# Patient Record
Sex: Female | Born: 1985 | Race: Black or African American | Hispanic: No | Marital: Single | Smoking: Never smoker
Health system: Southern US, Community
[De-identification: ages and names within clinical notes are randomized; demographics above are authoritative.]

## PROBLEM LIST (undated history)

## (undated) DIAGNOSIS — A599 Trichomoniasis, unspecified: Secondary | ICD-10-CM

## (undated) DIAGNOSIS — Z789 Other specified health status: Secondary | ICD-10-CM

## (undated) DIAGNOSIS — Z8619 Personal history of other infectious and parasitic diseases: Secondary | ICD-10-CM

## (undated) HISTORY — DX: Personal history of other infectious and parasitic diseases: Z86.19

## (undated) HISTORY — PX: INDUCED ABORTION: SHX677

## (undated) HISTORY — PX: NO PAST SURGERIES: SHX2092

## (undated) HISTORY — DX: Trichomoniasis, unspecified: A59.9

---

## 2011-03-10 LAB — OB RESULTS CONSOLE HEPATITIS B SURFACE ANTIGEN: Hepatitis B Surface Ag: NEGATIVE

## 2011-03-10 LAB — OB RESULTS CONSOLE RPR: RPR: NONREACTIVE

## 2011-03-10 LAB — OB RESULTS CONSOLE ANTIBODY SCREEN: Antibody Screen: NEGATIVE

## 2011-03-10 LAB — OB RESULTS CONSOLE ABO/RH: RH Type: POSITIVE

## 2011-03-10 LAB — OB RESULTS CONSOLE GC/CHLAMYDIA: Chlamydia: NEGATIVE

## 2011-04-25 NOTE — L&D Delivery Note (Addendum)
Delivery Note At 3:45 AM a viable female, "Cassie Lawrence"  was delivered via Vaginal, Spontaneous Delivery (Presentation: Right Occiput Anterior).  APGAR: 8, 9; weight 6 lb 14 oz (3118 g).   Placenta status: Intact, Spontaneous.  Cord: 3 vessels with the following complications: Cord loosely around neck, delivered through loop, then noted around body and leg..  Cord pH: NA Patient progressed rapidly to delivery after reaching complete dilation.  Anesthesia: Local  Episiotomy: None Lacerations: 2nd degree Suture Repair: 3.0 and 4.0 monocryl Est. Blood Loss (mL):   Mom to postpartum.  Baby to skin to skin with mom, then to Dad's arms due to mother's sedation from recent IV pain medication.. Inpatient circumcision planned.  Nigel Bridgeman 10/04/2011, 4:28 AM

## 2011-07-11 ENCOUNTER — Encounter (INDEPENDENT_AMBULATORY_CARE_PROVIDER_SITE_OTHER): Payer: Commercial Managed Care - PPO | Admitting: Obstetrics and Gynecology

## 2011-07-11 ENCOUNTER — Other Ambulatory Visit (INDEPENDENT_AMBULATORY_CARE_PROVIDER_SITE_OTHER): Payer: Commercial Managed Care - PPO

## 2011-07-11 DIAGNOSIS — Z331 Pregnant state, incidental: Secondary | ICD-10-CM

## 2011-07-25 ENCOUNTER — Encounter (INDEPENDENT_AMBULATORY_CARE_PROVIDER_SITE_OTHER): Payer: Commercial Managed Care - PPO | Admitting: Registered Nurse

## 2011-07-25 DIAGNOSIS — Z3480 Encounter for supervision of other normal pregnancy, unspecified trimester: Secondary | ICD-10-CM

## 2011-08-08 DIAGNOSIS — Z880 Allergy status to penicillin: Secondary | ICD-10-CM | POA: Insufficient documentation

## 2011-08-09 ENCOUNTER — Ambulatory Visit (INDEPENDENT_AMBULATORY_CARE_PROVIDER_SITE_OTHER): Payer: Commercial Managed Care - PPO | Admitting: Obstetrics and Gynecology

## 2011-08-09 ENCOUNTER — Encounter: Payer: Self-pay | Admitting: Obstetrics and Gynecology

## 2011-08-09 VITALS — BP 102/72 | Ht 65.0 in | Wt 188.0 lb

## 2011-08-09 DIAGNOSIS — Z880 Allergy status to penicillin: Secondary | ICD-10-CM

## 2011-08-09 DIAGNOSIS — T360X5A Adverse effect of penicillins, initial encounter: Secondary | ICD-10-CM

## 2011-08-09 DIAGNOSIS — Z888 Allergy status to other drugs, medicaments and biological substances status: Secondary | ICD-10-CM

## 2011-08-09 DIAGNOSIS — Z331 Pregnant state, incidental: Secondary | ICD-10-CM

## 2011-08-09 NOTE — Progress Notes (Signed)
A/P Fetal kick counts reviewed All patients  questions answered

## 2011-08-09 NOTE — Assessment & Plan Note (Signed)
Do GBS with sensitivities

## 2011-08-09 NOTE — Progress Notes (Deleted)
Pt without complaints REPEAT PAP VISIT  Subjective:    Cassie Lawrence is a 26 y.o. female who presents for a  repeat pap   History of previous cervical treatment:  {Therapies; recommendations colposcopy:727}  Last pap showed:  {Findings; lab pap smear results:16707::"no abnormalities"}  Date ***/***/2013  High Risk HPV present: {yes no:314532}  Colposcopy results:   {Exam; colposcopy summary:733::"in ***"}   Date:   ***/***/2013  Gardasil received: {yes no:314532}  Offered:  {Yes/No-Ex:120004} Tobacco use:  {EXAM; YES/NO:19492::"No"}  Objective:   Pelvic:   External genitalia: normal Perianal skin: no external genital warts noted Vagina: normal without discharge Cervix: normal in appearance  Assessment and Plan:   Abnormal pap or biopsy demonstrating ***  Pap # ***/3 performed Return to the office in 4 months for repeat Pap until 3 consecutive normal Pap Smear Continue Folic Acid 1 mg daily  Cassie Lawrence AMD 4/17/20135:17 PM

## 2011-08-09 NOTE — Progress Notes (Signed)
Pt states she has no concerns today.  

## 2011-08-09 NOTE — Patient Instructions (Signed)
Fetal Movement Counts Patient Name: __________________________________________________ Patient Due Date: ____________________ Kick counts is highly recommended in high risk pregnancies, but it is a good idea for every pregnant woman to do. Start counting fetal movements at 28 weeks of the pregnancy. Fetal movements increase after eating a full meal or eating or drinking something sweet (the blood sugar is higher). It is also important to drink plenty of fluids (well hydrated) before doing the count. Lie on your left side because it helps with the circulation or you can sit in a comfortable chair with your arms over your belly (abdomen) with no distractions around you. DOING THE COUNT  Try to do the count the same time of day each time you do it.   Mark the day and time, then see how long it takes for you to feel 10 movements (kicks, flutters, swishes, rolls). You should have at least 10 movements within 2 hours. You will most likely feel 10 movements in much less than 2 hours. If you do not, wait an hour and count again. After a couple of days you will see a pattern.   What you are looking for is a change in the pattern or not enough counts in 2 hours. Is it taking longer in time to reach 10 movements?  SEEK MEDICAL CARE IF:  You feel less than 10 counts in 2 hours. Tried twice.   No movement in one hour.   The pattern is changing or taking longer each day to reach 10 counts in 2 hours.   You feel the baby is not moving as it usually does.  Date: ____________ Movements: ____________ Start time: ____________ Finish time: ____________  Date: ____________ Movements: ____________ Start time: ____________ Finish time: ____________ Date: ____________ Movements: ____________ Start time: ____________ Finish time: ____________ Date: ____________ Movements: ____________ Start time: ____________ Finish time: ____________ Date: ____________ Movements: ____________ Start time: ____________ Finish time:  ____________ Date: ____________ Movements: ____________ Start time: ____________ Finish time: ____________ Date: ____________ Movements: ____________ Start time: ____________ Finish time: ____________ Date: ____________ Movements: ____________ Start time: ____________ Finish time: ____________  Date: ____________ Movements: ____________ Start time: ____________ Finish time: ____________ Date: ____________ Movements: ____________ Start time: ____________ Finish time: ____________ Date: ____________ Movements: ____________ Start time: ____________ Finish time: ____________ Date: ____________ Movements: ____________ Start time: ____________ Finish time: ____________ Date: ____________ Movements: ____________ Start time: ____________ Finish time: ____________ Date: ____________ Movements: ____________ Start time: ____________ Finish time: ____________ Date: ____________ Movements: ____________ Start time: ____________ Finish time: ____________  Date: ____________ Movements: ____________ Start time: ____________ Finish time: ____________ Date: ____________ Movements: ____________ Start time: ____________ Finish time: ____________ Date: ____________ Movements: ____________ Start time: ____________ Finish time: ____________ Date: ____________ Movements: ____________ Start time: ____________ Finish time: ____________ Date: ____________ Movements: ____________ Start time: ____________ Finish time: ____________ Date: ____________ Movements: ____________ Start time: ____________ Finish time: ____________ Date: ____________ Movements: ____________ Start time: ____________ Finish time: ____________  Date: ____________ Movements: ____________ Start time: ____________ Finish time: ____________ Date: ____________ Movements: ____________ Start time: ____________ Finish time: ____________ Date: ____________ Movements: ____________ Start time: ____________ Finish time: ____________ Date: ____________ Movements:  ____________ Start time: ____________ Finish time: ____________ Date: ____________ Movements: ____________ Start time: ____________ Finish time: ____________ Date: ____________ Movements: ____________ Start time: ____________ Finish time: ____________ Date: ____________ Movements: ____________ Start time: ____________ Finish time: ____________  Date: ____________ Movements: ____________ Start time: ____________ Finish time: ____________ Date: ____________ Movements: ____________ Start time: ____________ Finish time: ____________ Date: ____________ Movements: ____________ Start time:   ____________ Finish time: ____________ Date: ____________ Movements: ____________ Start time: ____________ Finish time: ____________ Date: ____________ Movements: ____________ Start time: ____________ Finish time: ____________ Date: ____________ Movements: ____________ Start time: ____________ Finish time: ____________ Date: ____________ Movements: ____________ Start time: ____________ Finish time: ____________  Date: ____________ Movements: ____________ Start time: ____________ Finish time: ____________ Date: ____________ Movements: ____________ Start time: ____________ Finish time: ____________ Date: ____________ Movements: ____________ Start time: ____________ Finish time: ____________ Date: ____________ Movements: ____________ Start time: ____________ Finish time: ____________ Date: ____________ Movements: ____________ Start time: ____________ Finish time: ____________ Date: ____________ Movements: ____________ Start time: ____________ Finish time: ____________ Date: ____________ Movements: ____________ Start time: ____________ Finish time: ____________  Date: ____________ Movements: ____________ Start time: ____________ Finish time: ____________ Date: ____________ Movements: ____________ Start time: ____________ Finish time: ____________ Date: ____________ Movements: ____________ Start time: ____________ Finish  time: ____________ Date: ____________ Movements: ____________ Start time: ____________ Finish time: ____________ Date: ____________ Movements: ____________ Start time: ____________ Finish time: ____________ Date: ____________ Movements: ____________ Start time: ____________ Finish time: ____________ Date: ____________ Movements: ____________ Start time: ____________ Finish time: ____________  Date: ____________ Movements: ____________ Start time: ____________ Finish time: ____________ Date: ____________ Movements: ____________ Start time: ____________ Finish time: ____________ Date: ____________ Movements: ____________ Start time: ____________ Finish time: ____________ Date: ____________ Movements: ____________ Start time: ____________ Finish time: ____________ Date: ____________ Movements: ____________ Start time: ____________ Finish time: ____________ Date: ____________ Movements: ____________ Start time: ____________ Finish time: ____________ Document Released: 05/10/2006 Document Revised: 03/30/2011 Document Reviewed: 11/10/2008 Exit[employer] Patient Information 2012 [employer]. 

## 2011-08-23 ENCOUNTER — Ambulatory Visit (INDEPENDENT_AMBULATORY_CARE_PROVIDER_SITE_OTHER): Payer: 59 | Admitting: Obstetrics and Gynecology

## 2011-08-23 ENCOUNTER — Encounter: Payer: Commercial Managed Care - PPO | Admitting: Obstetrics and Gynecology

## 2011-08-23 VITALS — BP 110/72 | Wt 194.0 lb

## 2011-08-23 DIAGNOSIS — Z331 Pregnant state, incidental: Secondary | ICD-10-CM

## 2011-08-23 NOTE — Progress Notes (Signed)
Pt c/o: off and on pressure. [facility] CMA

## 2011-08-23 NOTE — Progress Notes (Signed)
Doing well. Return to office in 2 weeks. Dr. Asal Teas 

## 2011-08-30 ENCOUNTER — Encounter: Payer: Self-pay | Admitting: Obstetrics and Gynecology

## 2011-08-30 ENCOUNTER — Telehealth: Payer: Self-pay | Admitting: Obstetrics and Gynecology

## 2011-08-30 ENCOUNTER — Ambulatory Visit (HOSPITAL_COMMUNITY)
Admission: RE | Admit: 2011-08-30 | Discharge: 2011-08-30 | Disposition: A | Discharge status: Home / Self Care | Payer: 59 | Source: Ambulatory Visit | Attending: Obstetrics and Gynecology | Admitting: Obstetrics and Gynecology

## 2011-08-30 ENCOUNTER — Ambulatory Visit (INDEPENDENT_AMBULATORY_CARE_PROVIDER_SITE_OTHER): Payer: 59 | Admitting: Obstetrics and Gynecology

## 2011-08-30 ENCOUNTER — Other Ambulatory Visit: Payer: Self-pay | Admitting: Obstetrics and Gynecology

## 2011-08-30 VITALS — BP 108/68 | Wt 190.0 lb

## 2011-08-30 DIAGNOSIS — Z3689 Encounter for other specified antenatal screening: Secondary | ICD-10-CM | POA: Insufficient documentation

## 2011-08-30 DIAGNOSIS — O4693 Antepartum hemorrhage, unspecified, third trimester: Secondary | ICD-10-CM

## 2011-08-30 DIAGNOSIS — O26859 Spotting complicating pregnancy, unspecified trimester: Secondary | ICD-10-CM

## 2011-08-30 DIAGNOSIS — O4690 Antepartum hemorrhage, unspecified, unspecified trimester: Secondary | ICD-10-CM | POA: Insufficient documentation

## 2011-08-30 LAB — POCT URINALYSIS DIPSTICK
Bilirubin, UA: NEGATIVE
Ketones, UA: NEGATIVE
Leukocytes, UA: NEGATIVE

## 2011-08-30 LAB — POCT WET PREP (WET MOUNT)
Bacteria Wet Prep HPF POC: NEGATIVE
KOH Wet Prep POC: NEGATIVE

## 2011-08-30 NOTE — Telephone Encounter (Signed)
Cassie Lawrence received 

## 2011-08-30 NOTE — Progress Notes (Signed)
Patient seen at office this am by Dr. Normand Sloop for spotting, with normal exam. Sent to [facility] for limited US for fluid, placental evaluation, and BPP. All findings WNL, with BPP 8/8. (See report). Patient has had no further bleeding, and no pain.  Reviewed findings with patient. Recommended increased rest next 24 hours and pelvic rest at present. To call for any increase in bleeding, pain, any decrease in FM, etc. To keep ROB appointment as scheduled next week or call prn.  Note to be OOW tonight.  Cassie Lawrence, CNM, [place] 08/30/11 1:50pm

## 2011-08-30 NOTE — Telephone Encounter (Signed)
TC from pt.  States had red vaginal spotting this AM with wiping. No recent IC or exam. +FM No cont.  Sched for eval with Dr ND today.

## 2011-08-30 NOTE — Progress Notes (Signed)
Pt with vaginal spotting this morning.  She worked all night.  No ctxs or LOF.  Positive FM Speculum exam: no blood in the vault.  cx closed /50%/high Results for orders placed in visit on 08/30/11  POCT URINALYSIS DIPSTICK      Component Value Range   Color, UA       Clarity, UA       Glucose, UA negative     Bilirubin, UA negative     Ketones, UA negative     Spec Grav, UA 1.010     Blood, UA 250     pH, UA       Protein, UA negative     Urobilinogen, UA negative     Nitrite, UA negative     Leukocytes, UA Negative     third trimester bleeding No evidence of bleeding on exam today Check urine culture [place] with BPP tomorrow.    We have no apointments today.  NST today [facility] reviewed

## 2011-08-30 NOTE — Progress Notes (Signed)
Pt c/o spotting starting this morning.

## 2011-09-05 ENCOUNTER — Ambulatory Visit (INDEPENDENT_AMBULATORY_CARE_PROVIDER_SITE_OTHER): Payer: 59 | Admitting: Obstetrics and Gynecology

## 2011-09-05 ENCOUNTER — Encounter: Payer: Self-pay | Admitting: Obstetrics and Gynecology

## 2011-09-05 VITALS — BP 100/60 | Wt 192.0 lb

## 2011-09-05 DIAGNOSIS — Z331 Pregnant state, incidental: Secondary | ICD-10-CM

## 2011-09-05 LAB — OB RESULTS CONSOLE GBS: GBS: NEGATIVE

## 2011-09-05 NOTE — Progress Notes (Signed)
Pt no longer with vb A/P GBS done today Fetal kick counts reviewed Labor reviewed with pt All patients  questions answered

## 2011-09-06 LAB — CULTURE, OB URINE: Colony Count: NO GROWTH

## 2011-09-08 LAB — CULTURE, BETA STREP (GROUP B ONLY)

## 2011-09-12 ENCOUNTER — Encounter: Payer: Self-pay | Admitting: Obstetrics and Gynecology

## 2011-09-12 ENCOUNTER — Ambulatory Visit (INDEPENDENT_AMBULATORY_CARE_PROVIDER_SITE_OTHER): Payer: 59 | Admitting: Obstetrics and Gynecology

## 2011-09-12 VITALS — BP 110/60 | Wt 193.0 lb

## 2011-09-12 DIAGNOSIS — Z331 Pregnant state, incidental: Secondary | ICD-10-CM

## 2011-09-12 NOTE — Progress Notes (Signed)
A/P GBS negative Fetal kick counts reviewed Labor reviewed with pt All patients  questions answered 

## 2011-09-20 ENCOUNTER — Ambulatory Visit (INDEPENDENT_AMBULATORY_CARE_PROVIDER_SITE_OTHER): Payer: 59 | Admitting: Obstetrics and Gynecology

## 2011-09-20 ENCOUNTER — Encounter: Payer: Self-pay | Admitting: Obstetrics and Gynecology

## 2011-09-20 VITALS — BP 104/70 | Wt 195.0 lb

## 2011-09-20 DIAGNOSIS — Z331 Pregnant state, incidental: Secondary | ICD-10-CM

## 2011-09-20 NOTE — Progress Notes (Signed)
Doing well.  Return in one week.

## 2011-09-27 ENCOUNTER — Ambulatory Visit (INDEPENDENT_AMBULATORY_CARE_PROVIDER_SITE_OTHER): Payer: 59 | Admitting: Obstetrics and Gynecology

## 2011-09-27 ENCOUNTER — Encounter: Payer: Self-pay | Admitting: Obstetrics and Gynecology

## 2011-09-27 VITALS — BP 104/74 | Wt 193.0 lb

## 2011-09-27 DIAGNOSIS — Z3490 Encounter for supervision of normal pregnancy, unspecified, unspecified trimester: Secondary | ICD-10-CM

## 2011-09-27 DIAGNOSIS — Z331 Pregnant state, incidental: Secondary | ICD-10-CM

## 2011-09-27 NOTE — Progress Notes (Signed)
Pt states she has no concerns. Pt desires cervix check.

## 2011-10-03 ENCOUNTER — Inpatient Hospital Stay (HOSPITAL_COMMUNITY)
Admission: AD | Admit: 2011-10-03 | Discharge: 2011-10-06 | DRG: 775 | Disposition: A | Discharge status: Home / Self Care | Payer: 59 | Source: Ambulatory Visit | Attending: Obstetrics and Gynecology | Admitting: Obstetrics and Gynecology

## 2011-10-03 ENCOUNTER — Encounter: Payer: Self-pay | Admitting: Obstetrics and Gynecology

## 2011-10-03 ENCOUNTER — Encounter (HOSPITAL_COMMUNITY): Payer: Self-pay | Admitting: *Deleted

## 2011-10-03 ENCOUNTER — Ambulatory Visit (INDEPENDENT_AMBULATORY_CARE_PROVIDER_SITE_OTHER): Payer: 59 | Admitting: Obstetrics and Gynecology

## 2011-10-03 VITALS — BP 98/66 | Wt 196.0 lb

## 2011-10-03 DIAGNOSIS — B373 Candidiasis of vulva and vagina: Secondary | ICD-10-CM

## 2011-10-03 DIAGNOSIS — IMO0001 Reserved for inherently not codable concepts without codable children: Secondary | ICD-10-CM

## 2011-10-03 DIAGNOSIS — Z331 Pregnant state, incidental: Secondary | ICD-10-CM

## 2011-10-03 HISTORY — DX: Other specified health status: Z78.9

## 2011-10-03 MED ORDER — FLUCONAZOLE 200 MG PO TABS: 200.0000 mg | ORAL_TABLET | Freq: Once | ORAL | Status: DC

## 2011-10-03 NOTE — MAU Note (Signed)
Pt presents with contractions, denies bleeding or ROM 

## 2011-10-03 NOTE — MAU Note (Signed)
Pt states she started feeling contractions this morning about 11am . Pt states contractions have increased in intensity

## 2011-10-03 NOTE — Progress Notes (Addendum)
Patient ID: Cassie Lawrence, female   DOB: Mar 02, 1986, 26 y.o.   MRN: [id number] Presents with c/o of uc about 6 per hour since this am denies srom, vag bleeding, N,V,D, or constipation, with  +fetal movement Mod amount yeast discharge, discussed diflucan rx. Plan report if sx change worsen, reviewed s/s uc more intense and frequent, srom, vag bleeding, daily fetal kick counts to report, Encouragged 8 water daily and frequent voids.Keep f/o office appt. Lavera Guise, CNM

## 2011-10-03 NOTE — Progress Notes (Signed)
C/O severe sharp cramping in lower abd radiating to lower back Pt desires cx check & to have membranes sweeped

## 2011-10-04 ENCOUNTER — Encounter (HOSPITAL_COMMUNITY): Payer: Self-pay | Admitting: *Deleted

## 2011-10-04 ENCOUNTER — Telehealth: Payer: Self-pay | Admitting: Obstetrics and Gynecology

## 2011-10-04 ENCOUNTER — Ambulatory Visit (INDEPENDENT_AMBULATORY_CARE_PROVIDER_SITE_OTHER): Payer: 59 | Admitting: Pediatrics

## 2011-10-04 DIAGNOSIS — IMO0001 Reserved for inherently not codable concepts without codable children: Secondary | ICD-10-CM

## 2011-10-04 DIAGNOSIS — Z7681 Expectant parent(s) prebirth pediatrician visit: Secondary | ICD-10-CM

## 2011-10-04 LAB — CBC
Hemoglobin: 13.6 g/dL (ref 12.0–15.0)
MCH: 30.8 pg (ref 26.0–34.0)
Platelets: 178 10*3/uL (ref 150–400)
RBC: 4.42 MIL/uL (ref 3.87–5.11)
WBC: 14.4 10*3/uL — ABNORMAL HIGH (ref 4.0–10.5)

## 2011-10-04 LAB — RPR: RPR Ser Ql: NONREACTIVE

## 2011-10-04 MED ORDER — BUTORPHANOL TARTRATE 2 MG/ML IJ SOLN
1.0000 mg | INTRAMUSCULAR | Status: DC | PRN
  Administered 2011-10-04: 1 mg via INTRAVENOUS
  Filled 2011-10-04: qty 1

## 2011-10-04 MED ORDER — LACTATED RINGERS IV SOLN: INTRAVENOUS | Status: DC

## 2011-10-04 MED ORDER — OXYCODONE-ACETAMINOPHEN 5-325 MG PO TABS: 1.0000 | ORAL_TABLET | ORAL | Status: DC | PRN

## 2011-10-04 MED ORDER — OXYTOCIN 20 UNITS IN LACTATED RINGERS INFUSION - SIMPLE: 125.0000 mL/h | Freq: Once | INTRAVENOUS | Status: DC

## 2011-10-04 MED ORDER — PHENYLEPHRINE 40 MCG/ML (10ML) SYRINGE FOR IV PUSH (FOR BLOOD PRESSURE SUPPORT)
80.0000 ug | PREFILLED_SYRINGE | INTRAVENOUS | Status: DC | PRN
  Filled 2011-10-04: qty 2

## 2011-10-04 MED ORDER — IBUPROFEN 600 MG PO TABS
600.0000 mg | ORAL_TABLET | Freq: Four times a day (QID) | ORAL | Status: DC
  Administered 2011-10-04 – 2011-10-06 (×9): 600 mg via ORAL
  Filled 2011-10-04 (×9): qty 1

## 2011-10-04 MED ORDER — LACTATED RINGERS IV SOLN
500.0000 mL | Freq: Once | INTRAVENOUS | Status: AC
  Administered 2011-10-04: 500 mL via INTRAVENOUS

## 2011-10-04 MED ORDER — FLEET ENEMA 7-19 GM/118ML RE ENEM: 1.0000 | ENEMA | RECTAL | Status: DC | PRN

## 2011-10-04 MED ORDER — ONDANSETRON HCL 4 MG/2ML IJ SOLN: 4.0000 mg | INTRAMUSCULAR | Status: DC | PRN

## 2011-10-04 MED ORDER — PRENATAL MULTIVITAMIN CH
1.0000 | ORAL_TABLET | Freq: Every day | ORAL | Status: DC
  Administered 2011-10-04 – 2011-10-06 (×3): 1 via ORAL
  Filled 2011-10-04 (×3): qty 1

## 2011-10-04 MED ORDER — IBUPROFEN 600 MG PO TABS
600.0000 mg | ORAL_TABLET | Freq: Four times a day (QID) | ORAL | Status: DC | PRN
  Administered 2011-10-04: 600 mg via ORAL
  Filled 2011-10-04: qty 1

## 2011-10-04 MED ORDER — TETANUS-DIPHTH-ACELL PERTUSSIS 5-2.5-18.5 LF-MCG/0.5 IM SUSP: 0.5000 mL | Freq: Once | INTRAMUSCULAR | Status: DC

## 2011-10-04 MED ORDER — FENTANYL 2.5 MCG/ML BUPIVACAINE 1/10 % EPIDURAL INFUSION (WH - ANES): 14.0000 mL/h | INTRAMUSCULAR | Status: DC

## 2011-10-04 MED ORDER — LIDOCAINE HCL (PF) 1 % IJ SOLN
30.0000 mL | INTRAMUSCULAR | Status: DC | PRN
  Administered 2011-10-04: 30 mL via SUBCUTANEOUS
  Filled 2011-10-04: qty 30

## 2011-10-04 MED ORDER — SIMETHICONE 80 MG PO CHEW: 80.0000 mg | CHEWABLE_TABLET | ORAL | Status: DC | PRN

## 2011-10-04 MED ORDER — SENNOSIDES-DOCUSATE SODIUM 8.6-50 MG PO TABS
2.0000 | ORAL_TABLET | Freq: Every day | ORAL | Status: DC
  Administered 2011-10-04 – 2011-10-05 (×2): 2 via ORAL

## 2011-10-04 MED ORDER — ACETAMINOPHEN 325 MG PO TABS: 650.0000 mg | ORAL_TABLET | ORAL | Status: DC | PRN

## 2011-10-04 MED ORDER — ZOLPIDEM TARTRATE 5 MG PO TABS: 5.0000 mg | ORAL_TABLET | Freq: Every evening | ORAL | Status: DC | PRN

## 2011-10-04 MED ORDER — PROMETHAZINE HCL 25 MG/ML IJ SOLN
12.5000 mg | INTRAMUSCULAR | Status: DC | PRN
  Administered 2011-10-04: 12.5 mg via INTRAVENOUS
  Filled 2011-10-04: qty 1

## 2011-10-04 MED ORDER — WITCH HAZEL-GLYCERIN EX PADS: 1.0000 "application " | MEDICATED_PAD | CUTANEOUS | Status: DC | PRN

## 2011-10-04 MED ORDER — ONDANSETRON HCL 4 MG/2ML IJ SOLN: 4.0000 mg | Freq: Four times a day (QID) | INTRAMUSCULAR | Status: DC | PRN

## 2011-10-04 MED ORDER — FENTANYL CITRATE 0.05 MG/ML IJ SOLN
100.0000 ug | INTRAMUSCULAR | Status: DC | PRN
  Administered 2011-10-04: 100 ug via INTRAVENOUS

## 2011-10-04 MED ORDER — EPHEDRINE 5 MG/ML INJ
10.0000 mg | INTRAVENOUS | Status: DC | PRN
  Filled 2011-10-04: qty 2

## 2011-10-04 MED ORDER — CITRIC ACID-SODIUM CITRATE 334-500 MG/5ML PO SOLN: 30.0000 mL | ORAL | Status: DC | PRN

## 2011-10-04 MED ORDER — LANOLIN HYDROUS EX OINT: TOPICAL_OINTMENT | CUTANEOUS | Status: DC | PRN

## 2011-10-04 MED ORDER — OXYTOCIN BOLUS FROM INFUSION
500.0000 mL | Freq: Once | INTRAVENOUS | Status: DC
  Filled 2011-10-04: qty 1000
  Filled 2011-10-04: qty 500

## 2011-10-04 MED ORDER — FENTANYL CITRATE 0.05 MG/ML IJ SOLN
INTRAMUSCULAR | Status: AC
Start: 2011-10-04 — End: 2011-10-04
  Filled 2011-10-04: qty 2

## 2011-10-04 MED ORDER — DIPHENHYDRAMINE HCL 50 MG/ML IJ SOLN: 12.5000 mg | INTRAMUSCULAR | Status: DC | PRN

## 2011-10-04 MED ORDER — LACTATED RINGERS IV SOLN: 500.0000 mL | INTRAVENOUS | Status: DC | PRN

## 2011-10-04 MED ORDER — BENZOCAINE-MENTHOL 20-0.5 % EX AERO
1.0000 "application " | INHALATION_SPRAY | CUTANEOUS | Status: DC | PRN
  Administered 2011-10-04: 1 via TOPICAL
  Filled 2011-10-04: qty 56

## 2011-10-04 MED ORDER — ONDANSETRON HCL 4 MG PO TABS: 4.0000 mg | ORAL_TABLET | ORAL | Status: DC | PRN

## 2011-10-04 MED ORDER — DIBUCAINE 1 % RE OINT: 1.0000 "application " | TOPICAL_OINTMENT | RECTAL | Status: DC | PRN

## 2011-10-04 MED ORDER — DIPHENHYDRAMINE HCL 25 MG PO CAPS: 25.0000 mg | ORAL_CAPSULE | Freq: Four times a day (QID) | ORAL | Status: DC | PRN

## 2011-10-04 NOTE — H&P (Signed)
Cassie Lawrence is a 26 y.o. female, G2P0010 at 45 3/7 weeks, presenting c/o persistent UCs x several hours, with small amount bloody mucus noted.  Reports +FM, denies leaking.  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  History of present pregnancy: Patient entered care at 10 weeks.  EDC of 10/07/11 was established by LMP and 1st trimester [place].  Anatomy scan was done at 19 weeks, with normal findings and an posterior placenta.  Her prenatal course was essentially uncomplicated.  Genetic screenings were WNL. At her last evalution, she was 3 cm..  History OB History    Grav Para Term Preterm Abortions TAB SAB Ect Mult Living   2    1         #1--2006 SAB #2--Current  Past Medical History  Diagnosis Date  . H/O chlamydia infection   . History of gonorrhea   . Trichomonas infection   . H/O candidiasis   . H/O varicella   . No pertinent past medical history    Past Surgical History  Procedure Date  . Induced abortion   . No past surgeries    Family History: family history includes Asthma in her maternal grandmother; COPD in her maternal grandmother; Diabetes in her maternal grandfather and maternal grandmother; Heart disease in her maternal grandmother; and Hypertension in her mother.  Social History:  reports that she has never smoked. She has never used smokeless tobacco. She reports that she does not drink alcohol or use illicit drugs.  FOB is involved and supportive.  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  ROS:  Contractions every 5 minutes, small amount bloody mucus, positive FM.  Dilation: 3.5 Effacement (%): 80 Station: -1 Blood pressure 124/82, pulse 76, temperature 98.6 F (37 C), temperature source Oral, resp. rate 18, height 5' 6.5" (1.689 m), weight 197 lb (89.359 kg), last menstrual period 12/25/2010, SpO2 100.00%. Exam Physical Exam   Chest clear Heart RRR without murmur Abd gravid,  NT Pelvic as above, posterior, IBOW  FHR reactive in cycles.  When in sleep cycle, 2 variable decels noted. UCs q 3-5, mild, moderate.  Prenatal labs: ABO, Rh:  O+ Antibody:  Neg Rubella:  Immune RPR:   NR HBsAg:  Neg  HIV:   NR GBS:   Negative 1st trimester screen and AFP WNL Glucola WNL Hgb WNL at NOB and at 28 weeks Sickle cell negative.  Assessment/Plan: IUP at 39 3/7 weeks Early labor Negative GBS Undecided regarding pain medication plan  Plan: Admit to Birthing Suite per consult with Dr. Normand Sloop Routine [facility] orders Plan IV hydration due to sporadic variable decels. Pain med, labor support prn.  Shonna Deiter 10/04/2011, 12:09 AM

## 2011-10-04 NOTE — Telephone Encounter (Signed)
TC from patient in f/u to previous call. UCs still q 5 min, slightly stronger.  Come to MAU.

## 2011-10-04 NOTE — Telephone Encounter (Signed)
TC from patient--G2P0 at 39 weeks, 3 cm in office earlier today. Now with contractions every 5 minutes x 2 hours, mild/moderate, bu short. Positive FM, no leaking or bleeding.  GBS negative.  Discussed status with patient, with options for observation or coming to MAU for evaluation. Decides to wait a little longer before coming to hospital. S/S of advancing labor reviewed.

## 2011-10-05 LAB — CBC
MCH: 30.6 pg (ref 26.0–34.0)
MCV: 91.9 fL (ref 78.0–100.0)
Platelets: 154 10*3/uL (ref 150–400)
RDW: 13.1 % (ref 11.5–15.5)

## 2011-10-05 NOTE — Progress Notes (Signed)
S: comfortable, little bleeding, slept     Breastfeeding with some assistance O VSS     abd soft, nt, ff      sm  flowperineum clean intact     -Homans sign bilaterally,       No edema      2 degree perineal lac well approximated no redness, edema, or drainage Hemoglobin & Hematocrit     Component Value Date/Time   HGB 11.7* 10/05/2011 0515   HCT 35.1* 10/05/2011 0515    A normal involution     Lactating     PP day 1 P considering micronor, continue care Lavera Guise, CNM

## 2011-10-05 NOTE — Progress Notes (Signed)
S: comfortable, little bleeding, slept little     Breast feeding O VSS     abd soft, nt, ff      sm  flowperineum clean intact     -Homans sign bilaterally,       No edema A normal involution     Lactating     PP day 1 P undecided regarding birth control, continue care Lavera Guise, CNM

## 2011-10-06 ENCOUNTER — Ambulatory Visit (HOSPITAL_COMMUNITY): Payer: 59

## 2011-10-06 ENCOUNTER — Institutional Professional Consult (permissible substitution): Payer: 59 | Admitting: Pediatrics

## 2011-10-06 MED ORDER — NORETHINDRONE 0.35 MG PO TABS: 1.0000 | ORAL_TABLET | Freq: Every day | ORAL | Status: DC

## 2011-10-06 MED ORDER — IBUPROFEN 600 MG PO TABS: 600.0000 mg | ORAL_TABLET | Freq: Four times a day (QID) | ORAL | Status: AC | PRN

## 2011-10-06 NOTE — Discharge Summary (Signed)
Obstetric Discharge Summary Reason for Admission: onset of labor Prenatal Procedures: NST and ultrasound Intrapartum Procedures: spontaneous vaginal delivery Postpartum Procedures: none Complications-Operative and Postpartum: 2nd degree perineal laceration Hemoglobin  Date Value Range Status  10/05/2011 11.7* 12.0 - 15.0 g/dL Final     HCT  Date Value Range Status  10/05/2011 35.1* 36.0 - 46.0 % Final   Hospital Course: Admitted 10/03/11 in early labor.  Negative GBS. Progressed well to delivery, using IV pain medication. Delivery was performed by Nigel Bridgeman, CNM, without complication. Patient and baby tolerated the procedure without difficulty, with  2nd laceration noted. Infant to FTN. Mother and infant then had an uncomplicated postpartum course, with breast feeding going well. Mom's physical exam was WNL, and she was discharged home in stable condition. Contraception plan was Micronor.  She received adequate benefit from po pain medications, using Motrin with benefit.  Plans Micronor for contraception.      Physical Exam:  General: alert Lochia: appropriate Uterine Fundus: firm Incision: healing well DVT Evaluation: No evidence of DVT seen on physical exam. Negative Homan's sign.  Discharge Diagnoses: Term Pregnancy-delivered  Discharge Information: Date: 10/06/2011 Activity: per [facility] handout Diet: routine Medications: Ibuprofen and Micronor Condition: stable Instructions: refer to practice specific booklet Discharge to: home Follow-up Information    Follow up with [facility] in 6 weeks. (Call to schedule 6 week appointment)          Newborn Data: Live born female  Birth Weight: 6 lb 14 oz (3118 g) APGAR: 8, 9  Home with mother.  Nigel Bridgeman 10/06/2011, 8:18 AM

## 2011-10-06 NOTE — Discharge Instructions (Signed)

## 2011-10-09 ENCOUNTER — Ambulatory Visit (HOSPITAL_COMMUNITY)
Admit: 2011-10-09 | Discharge: 2011-10-09 | Disposition: A | Discharge status: Home / Self Care | Payer: 59 | Attending: Pediatrics | Admitting: Pediatrics

## 2011-10-09 NOTE — Progress Notes (Signed)
Adult Lactation Consultation Outpatient Visit Note  Patient Name: Cassie Lawrence Date of Birth: 1985/09/14 Gestational Age at Delivery: Unknown Type of Delivery: vag  Breastfeeding History: Frequency of Breastfeeding: q1-2 Length of Feeding: 45 minutes- 1 hour Voids:5 last 24 hours  Stools: 6 last 24 hours  Supplementing / Method: Pumping:  Type of Pump:has own Medela PIS   Frequency:  Volume:    Comments:    Consultation Evaluation:  Initial Feeding Assessment: Pre-feed Weight:6- 5.8oz  2886g Post-feed Weight: 6- 6.7oz  2912g Amount Transferred: 26 cc's Comments:Mom has been using NS on both nipples at every feeding. Attempted without NS and baby latched and nursed for 20 minutes with lots of swallows noted. Mom pleased that baby latched without NS. Reports no pain, only tugging felt with latch. Breast did soften after nursing.   Additional Feeding Assessment: Pre-feed Weight:6- 6.7  2912g Post-feed Weight: 6- 7.1  2924g Amount Transferred: 12 cc's Comments:Baby latched without NS and nursed for about 10 minutes. Had last nursed 1 1/2 hours ago. Some swallows heard at the beginning of nursing on this breast but not as consistently as on the first breast. Breast did soften after nursing. Mom pleased that baby did latch without NS.  Additional Feeding Assessment: Pre-feed Weight: Post-feed Weight: Amount Transferred: Comments:  Total Breast milk Transferred this Visit: 38 cc's Total Supplement Given: 0  Additional Interventions:  Reviewed basic teaching with mom- wide open mouth and keeping the baby close to breast throughout the feeding. No questions at present, To call prn. Encouraged BFSG for support/encouragement. Follow-Up  To see Ped on Thursday.    Pamelia Hoit 10/09/2011, 9:55 AM

## 2011-10-17 ENCOUNTER — Encounter: Payer: 59 | Admitting: Obstetrics and Gynecology

## 2011-10-19 ENCOUNTER — Telehealth (HOSPITAL_COMMUNITY): Payer: Self-pay | Admitting: Lactation Services

## 2011-10-19 NOTE — Telephone Encounter (Signed)
Returned [employer] phone call.  Mom had been seen in outpatient lactation services on 10/09/11 and is using a nipple shield to help keep infant on breast.  Home health visited mom yesterday and mom stated the infant has lost more weight than with last home health visit last week.  Mom states she is still using a nipple shield and cannot seem to get infant latched without shield.  Infant is "feeding often" (per mom) every 1.5-3 hours; voids in past 24 hrs -7; stools in past 24 hrs -3-4.  Mom states stools are occasionally yellow but mostly light green "explosive" and "liquid" (per mom); when questioned if the stool appeared seedy, mom denied any seediness to the stool's appearance; mom denied any mucus appearance to the stools.  Mom states that she started pumping with Medela DEBP but is getting <41ml on one side and 30+ml from other side.  Infant has doctor's appointment today at 2:00p.  Outpatient appointment made for tomorrow 10/20/11 @  9:00 am.  Discussed plan of care with mom for today in preparation for tomorrow's outpatient appointment.  Plan of Care: 1.  When breastfeeding, use good hand massaging and compressions especially toward end of feeding to increase hind-milk intake. 2.  Supplement with EBM (if available) or formula according to instructions given by doctor today. 3.  Wait 30 minutes to 1 hour after feeding ends and pump using her Medela DEBP for 20 minutes with good massaging and compressions during pumping.  Hand express at end of pumping session to increase output of hind-milk. 4.  Record all feeding, pumping, voids, stools, and supplements on yellow log given in hospital.  (Mom verbalized she had a yellow sheet from hospital).   5.  Come for tomorrow's outpatient appointment at 9:00am; bring yellow log with her

## 2011-10-20 ENCOUNTER — Ambulatory Visit (HOSPITAL_COMMUNITY)
Admission: RE | Admit: 2011-10-20 | Discharge: 2011-10-20 | Disposition: A | Discharge status: Home / Self Care | Payer: 59 | Source: Ambulatory Visit | Attending: Obstetrics and Gynecology | Admitting: Obstetrics and Gynecology

## 2011-10-20 NOTE — Progress Notes (Signed)
Infant Lactation Consultation Outpatient Visit Note  Patient Name: Cassie Lawrence Date of Birth: [birth year] Birth Weight:   Gestational Age at Delivery: Gestational Age: <None> Type of Delivery:   Breastfeeding History Frequency of Breastfeeding: Q1-3 hours Length of Feeding: 32-45- Mom reports that baby only nurses for 5-10 minutes then goes off to sleep Voids: 11/ last 24 hours  Stools: 5 / last 24 hours  Supplementing / Method: Pumping:  Type of Pump: has her own PIS   Frequency: pumped about 5 times yesterday- did not pump through the night  Volume:  15-60 cc's  Comments: [employer] nurse came out Wednesday and weighed baby. He had lost 2 oz from weight 1 week ago. Mom reports that she can not get baby to nurse without nipple shield. Tries without it but he won't stay on. Has given bottles of formula and breast milk in the last 24 hours because she was concerned about the baby's weight. Mom reports that she is frustrated with breast feeding and wondering if she should go to formula.     Consultation Evaluation:  Initial Feeding Assessment: Pre-feed Weight:6-12.1  3066g Post-feed Weight: 6-12.7  3080g Amount Transferred: 14 cc's/ 9 cc's  of those from SNS Comments: Baby latched to right breast without nipple shield but took a few sucks then off to sleep. No swallows heard. Used SNS and baby was only slightly more vigorous. Baby had 75 cc's at 6 am (3 hours ago) and 1 oz formula 1 hour ago.    Additional Feeding Assessment: Pre-feed Weight: 6-12.7  3080g Post-feed Weight: 6-13.8  3112g Amount Transferred:32 cc's  26 from SNS Comments: Used nipple shield and baby was doing lots of non nutritive sucking at the breast. No swallows noted. Used SNS and baby did take 26 cc's from SNS. For a few minutes lots of good swallows noted. Baby off to sleep. Mature milk noted in NS.  Additional Feeding Assessment: Pre-feed Weight: Post-feed Weight: Amount Transferred: Comments:  Total  Breast milk Transferred this Visit:  Total Supplement Given:   Additional Interventions: Encouraged Mom to pump q 3 hours to promote milk supply. To feed all EBM to baby either by SNS or bottle. Encouraged to nurse baby without nipple shield if he will with SNS. If he won't stay latched then use NS.   Follow-Up  Op appointment here at 2:30 on Monday July 1. To call prn    Pamelia Hoit 10/20/2011, 10:09 AM

## 2011-10-23 ENCOUNTER — Ambulatory Visit (HOSPITAL_COMMUNITY)
Admission: RE | Admit: 2011-10-23 | Discharge: 2011-10-23 | Disposition: A | Discharge status: Home / Self Care | Payer: 59 | Source: Ambulatory Visit | Attending: Obstetrics and Gynecology | Admitting: Obstetrics and Gynecology

## 2011-10-23 NOTE — Progress Notes (Addendum)
Infant Lactation Consultation Outpatient Visit Note  Patient Name: Cassie Lawrence Date of Birth: October 06, 1985 Birth Weight:   Gestational Age at Delivery: Gestational Age: <None> Type of Delivery:   F/U weight check ( due to slow weight gain for infant and decrease in milk  Supply ) Per mom has been pumping with ( DEBP Medela) , every 3-4 hours     Per mom  "Cassie Lawrence " is 47 weeks old tomorrow, and last weight 6-12oz ,    Breastfeeding History Frequency of Breastfeeding:  Length of Feeding:  Voids: >7  Stools: >4 yellow   Supplementing / Method: Pumping:  Type of Pump: DEBP Medela    Frequency:every 3-4 days average range , 45-16ml ( best ,   Volume:    Comments: pER mom with the exception of the last few days had been breast feeding at the breast and supplementing with a bottle                    The last few days have been a challenge because FOB is laid up with a foot injury. Therefore mom has been pumping and bottle feeding and supplementing some with formula.    Consultation Evaluation:  Initial Feeding Assessment: Right breast  Pre-feed Weight:7.6.7 oz ( 3364g)  Post-feed Weight:7.6.8 oz ( 3368g)  Amount Transferred:73ml  Comments:Mom independent with latch in cross cradle, ( needed encouragement to increase the depth). Infant latches well , initially stayed in a consistent pattern with swallows , increased with breast compressions. Infant noted to get non - nutritive at times ( mom aware) ,                     Additional Feeding Assessment: Left breast  Pre-feed Weight:7.6.8 oz ( 3368g)  Post-feed Weight: 7.7.0 oz ( 3374g)  Amount Transferred:88ml  Comments: Mom independent with latch , infant had a deeper latch and was consistent with pattern. Swallows noted . Intermittent non nutritive feeding pattern ( mom aware). @ one point  Released and relatched.    Total Breast milk Transferred this Visit: 10 ml  Total Supplement Given: 50 ml of expressed milk in a bottle   Lactation evaluation of feedings .            LC assessed infants tongue prior to latch and after and noted decreased forward mobility. ( Questioning whether this is preventing infant from transferring increased volume at breast. LC fed infant EBM from a Medela bottle to observe is sucking pattern and intermittently noted a rubbing sound ( and noted a change in his pattern of sucking pattern) ( Encouraged mom to have it checked out by Dr. Eddie Candle when she goes for next check up) .   Additional Interventions: 1) Keep up the great efforts with feedings and pumping . 2) Feedings , every 2-3 hours and on demand and plan on supplementing after feedings adlib 3) Extra pumping - after  Feedings 10 -15 mins . 4) Between feedings and pumpings should be 8-10 X's per day. 5) encouraged mom to do what she can.    Follow-Up - Follow up with Dr. Eddie Candle office , also consider BF support group for weight check or smart check  , or call for F/u weight LC office .       Cassie Lawrence 10/23/2011, 2:53 PM

## 2011-11-14 ENCOUNTER — Telehealth: Payer: Self-pay | Admitting: Obstetrics and Gynecology

## 2011-11-14 ENCOUNTER — Other Ambulatory Visit: Payer: Self-pay

## 2011-11-14 MED ORDER — HYDROCORTISONE ACE-PRAMOXINE 1-1 % RE FOAM: 1.0000 | Freq: Three times a day (TID) | RECTAL | Status: AC

## 2011-11-14 NOTE — Telephone Encounter (Signed)
TC to pt. States has had blood in her stool x 4 since 11/06/11.   S/P vag delivery 10/04/11.  Has hx constipation. Feels like a "sharp edge" when having BM.  Was taking stool softener but D/C'd.  Advised to resume, increase water, high fiber diet.   Per DD to use TUCKS prn .  Rx for Protofoam. To call with no improvement.

## 2011-11-14 NOTE — Telephone Encounter (Signed)
Niccole/epic

## 2011-11-14 NOTE — Telephone Encounter (Signed)
No nd pt

## 2011-11-21 ENCOUNTER — Ambulatory Visit (INDEPENDENT_AMBULATORY_CARE_PROVIDER_SITE_OTHER): Payer: 59 | Admitting: Obstetrics and Gynecology

## 2011-11-21 ENCOUNTER — Encounter: Payer: Self-pay | Admitting: Obstetrics and Gynecology

## 2011-11-21 NOTE — Progress Notes (Signed)
S: comfortable      Bleeding stopped on period now, using micronor     Breastfeeding pumps O VSS     abd soft, nontender     Diastasis recti 1 finger breath     Normal hair distrubition mons pubis,      EGBUS and perineum WNL,  vagina  pink, moist normal rugae, good vaginal tone cerix LTC, no cervical motion tenderness, uterus firm small A pp normal involution P f/o annually and prn last pap wnl 11/2009 Lavera Guise, CNM

## 2011-11-21 NOTE — Progress Notes (Signed)
Date of delivery: 10/04/2011 Female Name: Cassie Lawrence Vaginal delivery:yes Cesarean section:no Tubal ligation:no GDM:no Breast Feeding:yes Bottle Feeding:no Post-Partum Blues:no Abnormal pap:no Normal GU function: yes Normal GI function:no, Problems with constipation. Stool softner not really helping. Returning to work:no EPDS: 2

## 2011-12-15 ENCOUNTER — Telehealth: Payer: Self-pay | Admitting: Obstetrics and Gynecology

## 2011-12-15 NOTE — Telephone Encounter (Signed)
Tc to [facility] regarding medication change request.  Pt was rx'd Proctofoam HC by DD, med is $68 copay to pt, asks if rx can be changed to Anusol HC, per [facility] ok to change.  Anusol HC 25mg  Supp 1 applicatorful rectally Q8 hrs prn, #12 RF x 3, spoke w/ Judie Grieve

## 2011-12-27 ENCOUNTER — Ambulatory Visit (INDEPENDENT_AMBULATORY_CARE_PROVIDER_SITE_OTHER): Payer: Self-pay | Admitting: Obstetrics and Gynecology

## 2011-12-27 ENCOUNTER — Encounter: Payer: Self-pay | Admitting: Obstetrics and Gynecology

## 2011-12-27 VITALS — BP 108/78 | HR 76 | Ht 65.5 in | Wt 167.0 lb

## 2011-12-27 DIAGNOSIS — Z124 Encounter for screening for malignant neoplasm of cervix: Secondary | ICD-10-CM

## 2011-12-27 MED ORDER — NORETHINDRONE 0.35 MG PO TABS: 1.0000 | ORAL_TABLET | Freq: Every day | ORAL | Status: DC

## 2011-12-27 NOTE — Progress Notes (Signed)
Last Pap: 11/2009 per pt WNL: Yes Regular Periods:no Contraception: pill ortho micronor  Monthly Breast exam: no, every other month Tetanus<39yrs:yes Nl.Bladder Function:yes Daily BMs:no Healthy Diet:no Calcium:yes Mammogram:no Date of Mammogram: n/a Exercise:yes Have often Exercise: once per week  Seatbelt: no Abuse at home: no Stressful work:yes Sigmoid-colonoscopy: n/a BP 108/78  Pulse 76  Ht 5' 5.5" (1.664 m)  Wt 167 lb (75.751 kg)  BMI 27.37 kg/m2  LMP 11/19/2011  Breastfeeding? Yes Pt without complaints Physical Examination: General appearance - alert, well appearing, and in no distress Mental status - normal mood, behavior, speech, dress, motor activity, and thought processes Neck - supple, no significant adenopathy, thyroid exam: thyroid is normal in size without nodules or tenderness Chest - clear to auscultation, no wheezes, rales or rhonchi, symmetric air entry Heart - normal rate and regular rhythm Abdomen - soft, nontender, nondistended, no masses or organomegaly Breasts - breasts appear normal, no suspicious masses, no skin or nipple changes or axillary nodes Pelvic - normal external genitalia, vulva, vagina, cervix, uterus and adnexa Rectal -  rectal exam not indicated Back exam - full range of motion, no tenderness, palpable spasm or pain on motion Neurological - alert, oriented, normal speech, no focal findings or movement disorder noted Musculoskeletal - no joint tenderness, deformity or swelling Extremities - no edema, redness or tenderness in the calves or thighs Skin - normal coloration and turgor, no rashes, no suspicious skin lesions noted Routine exam Pap sent yes Mammogram due no Pt desires to stay on micronor RT 1 yr  Bone Density: No PCP: none Change in PMH: none Change in UJW:JXBJ

## 2011-12-28 LAB — PAP IG W/ RFLX HPV ASCU

## 2013-12-24 LAB — OB RESULTS CONSOLE GC/CHLAMYDIA
Chlamydia: NEGATIVE
Gonorrhea: NEGATIVE

## 2013-12-24 LAB — OB RESULTS CONSOLE RPR: RPR: NONREACTIVE

## 2013-12-24 LAB — OB RESULTS CONSOLE GBS: GBS: POSITIVE

## 2013-12-24 LAB — OB RESULTS CONSOLE HIV ANTIBODY (ROUTINE TESTING): HIV: NONREACTIVE

## 2014-02-23 ENCOUNTER — Encounter: Payer: Self-pay | Admitting: Obstetrics and Gynecology

## 2014-04-24 NOTE — L&D Delivery Note (Addendum)
Operative Delivery Note At 7:41 AM a viable female was delivered via Vaginal, Vacuum Investment banker, operational).  Presentation: vertex; Position: Left,, Occiput,, Anterior; Station: +1-+2.  Tight double nuchal cord unable to be reduced.  Delivered without reducing or clamping and cutting.  Verbal consent: obtained from patient.  Risks and benefits discussed in detail.  Risks include, but are not limited to the risks of anesthesia, bleeding, infection, damage to maternal tissues, fetal cephalhematoma.  There is also the risk of inability to effect vaginal delivery of the head, or shoulder dystocia that cannot be resolved by established maneuvers, leading to the need for emergency cesarean section.  APGAR: 9, 9; weight  .   Placenta status: Intact, Spontaneous.   Cord: 3 vessels with the following complications: None.  Cord pH: n/a  Anesthesia: None  Episiotomy: None Lacerations: 1st degree Suture Repair: 3.0 vicryl Est. Blood Loss (mL):  300 cc  Mom to postpartum.  Baby to Nursery.  Shuree Brossart Y 06/18/2014, 8:20 AM

## 2014-06-17 ENCOUNTER — Inpatient Hospital Stay (HOSPITAL_COMMUNITY)
Admission: AD | Admit: 2014-06-17 | Discharge: 2014-06-21 | DRG: 774 | Disposition: A | Discharge status: Home / Self Care | Payer: 59 | Source: Ambulatory Visit | Attending: Obstetrics & Gynecology | Admitting: Obstetrics & Gynecology

## 2014-06-17 ENCOUNTER — Encounter (HOSPITAL_COMMUNITY): Payer: Self-pay

## 2014-06-17 DIAGNOSIS — K219 Gastro-esophageal reflux disease without esophagitis: Secondary | ICD-10-CM | POA: Diagnosis present

## 2014-06-17 DIAGNOSIS — Z833 Family history of diabetes mellitus: Secondary | ICD-10-CM | POA: Diagnosis not present

## 2014-06-17 DIAGNOSIS — IMO0002 Reserved for concepts with insufficient information to code with codable children: Secondary | ICD-10-CM | POA: Diagnosis present

## 2014-06-17 DIAGNOSIS — Z3A39 39 weeks gestation of pregnancy: Secondary | ICD-10-CM | POA: Diagnosis present

## 2014-06-17 DIAGNOSIS — I429 Cardiomyopathy, unspecified: Secondary | ICD-10-CM | POA: Diagnosis not present

## 2014-06-17 DIAGNOSIS — Z8249 Family history of ischemic heart disease and other diseases of the circulatory system: Secondary | ICD-10-CM | POA: Diagnosis not present

## 2014-06-17 DIAGNOSIS — O1403 Mild to moderate pre-eclampsia, third trimester: Secondary | ICD-10-CM | POA: Diagnosis present

## 2014-06-17 DIAGNOSIS — Z6834 Body mass index (BMI) 34.0-34.9, adult: Secondary | ICD-10-CM

## 2014-06-17 DIAGNOSIS — O99214 Obesity complicating childbirth: Secondary | ICD-10-CM | POA: Diagnosis present

## 2014-06-17 DIAGNOSIS — O903 Peripartum cardiomyopathy: Secondary | ICD-10-CM | POA: Diagnosis not present

## 2014-06-17 DIAGNOSIS — Z880 Allergy status to penicillin: Secondary | ICD-10-CM | POA: Diagnosis not present

## 2014-06-17 DIAGNOSIS — I5021 Acute systolic (congestive) heart failure: Secondary | ICD-10-CM | POA: Diagnosis not present

## 2014-06-17 DIAGNOSIS — O9962 Diseases of the digestive system complicating childbirth: Secondary | ICD-10-CM | POA: Diagnosis present

## 2014-06-17 DIAGNOSIS — O365930 Maternal care for other known or suspected poor fetal growth, third trimester, not applicable or unspecified: Secondary | ICD-10-CM | POA: Diagnosis present

## 2014-06-17 DIAGNOSIS — R0602 Shortness of breath: Secondary | ICD-10-CM

## 2014-06-17 LAB — COMPREHENSIVE METABOLIC PANEL
ALT: 38 U/L — ABNORMAL HIGH (ref 0–35)
ANION GAP: 3 — AB (ref 5–15)
AST: 44 U/L — AB (ref 0–37)
Albumin: 2.9 g/dL — ABNORMAL LOW (ref 3.5–5.2)
Alkaline Phosphatase: 200 U/L — ABNORMAL HIGH (ref 39–117)
BUN: 7 mg/dL (ref 6–23)
CALCIUM: 8.8 mg/dL (ref 8.4–10.5)
CHLORIDE: 111 mmol/L (ref 96–112)
CO2: 21 mmol/L (ref 19–32)
Creatinine, Ser: 0.56 mg/dL (ref 0.50–1.10)
Glucose, Bld: 97 mg/dL (ref 70–99)
Potassium: 3.9 mmol/L (ref 3.5–5.1)
Sodium: 135 mmol/L (ref 135–145)
Total Bilirubin: 0.4 mg/dL (ref 0.3–1.2)
Total Protein: 6.4 g/dL (ref 6.0–8.3)

## 2014-06-17 LAB — LACTATE DEHYDROGENASE: LDH: 216 U/L (ref 94–250)

## 2014-06-17 LAB — TYPE AND SCREEN
ABO/RH(D): O POS
Antibody Screen: NEGATIVE

## 2014-06-17 LAB — CBC
HCT: 39 % (ref 36.0–46.0)
Hemoglobin: 13.3 g/dL (ref 12.0–15.0)
MCH: 30.9 pg (ref 26.0–34.0)
MCHC: 34.1 g/dL (ref 30.0–36.0)
MCV: 90.7 fL (ref 78.0–100.0)
Platelets: 173 10*3/uL (ref 150–400)
RBC: 4.3 MIL/uL (ref 3.87–5.11)
RDW: 13.4 % (ref 11.5–15.5)
WBC: 10.4 10*3/uL (ref 4.0–10.5)

## 2014-06-17 LAB — PROTEIN / CREATININE RATIO, URINE
Creatinine, Urine: 101 mg/dL
PROTEIN CREATININE RATIO: 0.17 — AB (ref 0.00–0.15)
Total Protein, Urine: 17 mg/dL

## 2014-06-17 LAB — URIC ACID: Uric Acid, Serum: 6.4 mg/dL (ref 2.4–7.0)

## 2014-06-17 LAB — ABO/RH: ABO/RH(D): O POS

## 2014-06-17 MED ORDER — OXYTOCIN BOLUS FROM INFUSION: 500.0000 mL | INTRAVENOUS | Status: DC

## 2014-06-17 MED ORDER — LACTATED RINGERS IV SOLN
INTRAVENOUS | Status: DC
  Administered 2014-06-17 – 2014-06-18 (×4): via INTRAVENOUS

## 2014-06-17 MED ORDER — LACTATED RINGERS IV SOLN
500.0000 mL | INTRAVENOUS | Status: DC | PRN
  Administered 2014-06-17: 500 mL via INTRAVENOUS
  Administered 2014-06-18: 250 mL via INTRAVENOUS
  Administered 2014-06-18: 500 mL via INTRAVENOUS

## 2014-06-17 MED ORDER — OXYTOCIN 40 UNITS IN LACTATED RINGERS INFUSION - SIMPLE MED
62.5000 mL/h | INTRAVENOUS | Status: DC
  Filled 2014-06-17: qty 1000

## 2014-06-17 MED ORDER — TERBUTALINE SULFATE 1 MG/ML IJ SOLN: 0.2500 mg | Freq: Once | INTRAMUSCULAR | Status: AC | PRN

## 2014-06-17 MED ORDER — MISOPROSTOL 25 MCG QUARTER TABLET
25.0000 ug | ORAL_TABLET | ORAL | Status: DC | PRN
Start: 2014-06-17 — End: 2014-06-18
  Filled 2014-06-17: qty 1

## 2014-06-17 MED ORDER — ACETAMINOPHEN 325 MG PO TABS: 650.0000 mg | ORAL_TABLET | ORAL | Status: DC | PRN

## 2014-06-17 MED ORDER — ONDANSETRON HCL 4 MG/2ML IJ SOLN: 4.0000 mg | Freq: Four times a day (QID) | INTRAMUSCULAR | Status: DC | PRN

## 2014-06-17 MED ORDER — CITRIC ACID-SODIUM CITRATE 334-500 MG/5ML PO SOLN
30.0000 mL | ORAL | Status: DC | PRN
Start: 2014-06-17 — End: 2014-06-18
  Filled 2014-06-17: qty 15

## 2014-06-17 MED ORDER — NALBUPHINE HCL 10 MG/ML IJ SOLN: 10.0000 mg | INTRAMUSCULAR | Status: DC | PRN

## 2014-06-17 MED ORDER — VANCOMYCIN HCL IN DEXTROSE 1-5 GM/200ML-% IV SOLN
1000.0000 mg | Freq: Two times a day (BID) | INTRAVENOUS | Status: DC
  Administered 2014-06-18: 1000 mg via INTRAVENOUS
  Filled 2014-06-17 (×2): qty 200

## 2014-06-17 MED ORDER — OXYCODONE-ACETAMINOPHEN 5-325 MG PO TABS: 1.0000 | ORAL_TABLET | ORAL | Status: DC | PRN

## 2014-06-17 MED ORDER — LIDOCAINE HCL (PF) 1 % IJ SOLN
30.0000 mL | INTRAMUSCULAR | Status: AC | PRN
  Administered 2014-06-18: 30 mL via SUBCUTANEOUS
  Filled 2014-06-17: qty 30

## 2014-06-17 MED ORDER — OXYCODONE-ACETAMINOPHEN 5-325 MG PO TABS: 2.0000 | ORAL_TABLET | ORAL | Status: DC | PRN

## 2014-06-17 NOTE — Progress Notes (Signed)
Cassie Lawrence MRN: [id number]  Subjective: -Nurse call and report variable decelerations.  Strip Reviewed.  Dr. Delrae Sawyers contacted.  In room to discuss present fetal tracing.  Patient asleep, but easily awakened.  Husband at bedside.  Patient reports some perception of contractions.    Objective: BP 137/96 mmHg  Pulse 101  Temp(Src) 98.4 F (36.9 C) (Oral)  Resp 18  Ht 5\' 5"  (1.651 m)  Wt 218 lb (98.884 kg)  BMI 36.28 kg/m2  SpO2 99%     FHT: 125 bpm, Mod Var, + Variable and Prolonged Decels, +Accels UC:  Occasional  SVE:   Dilation: 3 Effacement (%): 30 Exam by:: Cassie Lawrence, CNM Membranes: Intact Pitocin:None Foley Bulb remains in place  Assessment:  IUP at 39.6wks Cat II FT  PreEclampsia Cervical Ripening  Plan: -Discussed FHT and concerns expressed -Discussed interventions that may assist in improving fetal tracing: AROM, IUPC, Amnioinfusion, FSE -Questions and concerns answered -Patient requests time to speak with husband  2352 A: Cat I FT  P: -Dr. Delrae Sawyers at bedside  -Discussed current FHT-reassuring -R/B of C/S discussed, questions and concerns addressed--Patient consents for c/s in emergent situation -Will not start pitocin and will leave in foley catheter until expels  -Start low dose pitocin once foley catheter out -Questions and concerns further addressed -Continue other mgmt as ordered -Dr. Delrae Sawyers to remain in house until shift end or delivery  [facility], Cassie Lawrence [facility], CNM 06/17/2014, 11:37 PM

## 2014-06-17 NOTE — Progress Notes (Signed)
Cassie Lawrence, [id number]   Subjective -Patient up to restroom.  Husband at bedside, supportive.   Objective Filed Vitals:   06/17/14 1807  BP: 131/84  Pulse: 98         Results for orders placed or performed during the hospital encounter of 06/17/14 (from the past 24 hour(s))  CBC     Status: None   Collection Time: 06/17/14  4:55 PM  Result Value Ref Range   WBC 10.4 4.0 - 10.5 K/uL   RBC 4.30 3.87 - 5.11 MIL/uL   Hemoglobin 13.3 12.0 - 15.0 g/dL   HCT 50.3 54.6 - 56.8 %   MCV 90.7 78.0 - 100.0 fL   MCH 30.9 26.0 - 34.0 pg   MCHC 34.1 30.0 - 36.0 g/dL   RDW 12.7 51.7 - 00.1 %   Platelets 173 150 - 400 K/uL  Type and screen     Status: None   Collection Time: 06/17/14  4:55 PM  Result Value Ref Range   ABO/RH(D) O POS    Antibody Screen NEG    Sample Expiration 06/20/2014   Uric acid     Status: None   Collection Time: 06/17/14  4:55 PM  Result Value Ref Range   Uric Acid, Serum 6.4 2.4 - 7.0 mg/dL  Lactate dehydrogenase     Status: None   Collection Time: 06/17/14  4:55 PM  Result Value Ref Range   LDH 216 94 - 250 U/L  Comprehensive metabolic panel     Status: Abnormal   Collection Time: 06/17/14  4:55 PM  Result Value Ref Range   Sodium 135 135 - 145 mmol/L   Potassium 3.9 3.5 - 5.1 mmol/L   Chloride 111 96 - 112 mmol/L   CO2 21 19 - 32 mmol/L   Glucose, Bld 97 70 - 99 mg/dL   BUN 7 6 - 23 mg/dL   Creatinine, Ser 7.49 0.50 - 1.10 mg/dL   Calcium 8.8 8.4 - 44.9 mg/dL   Total Protein 6.4 6.0 - 8.3 g/dL   Albumin 2.9 (L) 3.5 - 5.2 g/dL   AST 44 (H) 0 - 37 U/L   ALT 38 (H) 0 - 35 U/L   Alkaline Phosphatase 200 (H) 39 - 117 U/L   Total Bilirubin 0.4 0.3 - 1.2 mg/dL   GFR calc non Af Amer >90 >90 mL/min   GFR calc Af Amer >90 >90 mL/min   Anion gap 3 (L) 5 - 15  Protein / creatinine ratio, urine     Status: Abnormal   Collection Time: 06/17/14  6:10 PM  Result Value Ref Range   Creatinine, Urine 101.00 mg/dL   Total Protein, Urine 17 mg/dL   Protein  Creatinine Ratio 0.17 (H) 0.00 - 0.15    Physical Exam  Constitutional: She is oriented to person, place, and time and well-developed, well-nourished, and in no distress.  HENT:  Head: Normocephalic and atraumatic.  Eyes: EOM are normal.  Neck: Normal range of motion.  Cardiovascular: Normal rate, regular rhythm and normal heart sounds.   Pulmonary/Chest: Effort normal and breath sounds normal. No respiratory distress.  Abdominal: Soft. Bowel sounds are normal.  Gravid--fundal height appears AGA, Soft, NT  Musculoskeletal: Normal range of motion. She exhibits edema (.pedial, +2).  Neurological: She is alert and oriented to person, place, and time.  Skin: Skin is warm and dry.  Psychiatric: Affect and judgment normal.    QPR:FFMBWGYK: 1.5 Effacement (%): 30 Presentation: Vertex Exam by:: Narek Kniss  Pelvis: -Proven:Yes  to 6lbs 14oz -Adequate: DC> 13cm Leopolds: -Position:Vertex -EFW: 6 to 6 1/2lbs  Monitoring: FHR: 135 bpm, Mod Var, -Decels, +Accels UC:  Occasional  Induction/Augmentation Agent: Pitocin: None Cytotec: No Dose Given Membranes: Intact Foley Catheter Placed at 1800  Assessment IUP at 39.5 wks Cat I FT Bishop Score: 4 PreEclampsia Cervical Ripening  Plan -Discussed r/b of induction including fetal distress, serial induction, pain, and increased risk of c/s delivery -Discussed induction methods including cervical ripening agents, foley bulbs, and pitocin -Questions and concerns addressed -Foley cathter placed without difficulties -Continue other mgmt as ordered  Cassie Lawrence, CNM 06/17/2014, 6:25 PM

## 2014-06-17 NOTE — H&P (Signed)
Cassie Lawrence is a 29 y.o. female, G3P1011 at 39.5 weeks, presenting for IOL for Elevated BP and IUGR.  Patient in office today and noted to have increased bp, swelling, and HA.  US revealed fetus with EFW of 5lb 14oz, AFI of 8.0cm, and BPP of 8/8. Patient GBS Positive in NOB urine screen and with PCN allergy.    Patient Active Problem List   Diagnosis Date Noted  . IUGR (intrauterine growth restriction) 06/17/2014  . Vaginal delivery 10/04/2011  . Second degree perineal laceration during delivery 10/04/2011  . Pregnant state, incidental 08/23/2011  . Penicillin allergy 08/08/2011    History of present pregnancy: Patient entered care at 17.4 weeks.   EDC of 06/19/2014 was established by LMP of 09/12/2013.   Anatomy scan:  20.3 weeks, with normal, but limited findings and an posterior placenta.   Additional [place] evaluations:   -17.4wks: SIUP, NORMAL FLUID, CX 3.44 CM, GROWTH 23% -20.3wks: Anatomy Single gestation, normal fluid, growth is 38 percentile, cervix measures 5.20 cm, anatomy normal, not all anatomy seen, female -39.5wks: SIUP, VERTEX, LOW NORMAL FLUID, AFI 8.0, GROWTH 2%, BPP 8/8.  Significant prenatal events:  Patient with c/o acid reflux, hand swelling, and constant headaches, but declined nuerology consult.  Patient did receive influenza and tDap vaccine.    Last evaluation:  06/17/2014 by Dr. AVS---2/30/-3, FHR 135, BP 120/98, 124/100; Wt 218lbs  OB History    Gravida Para Term Preterm AB TAB SAB Ectopic Multiple Living   3 1 1  1 1    1      Past Medical History  Diagnosis Date  . H/O chlamydia infection   . History of gonorrhea   . Trichomonas infection   . H/O candidiasis   . H/O varicella   . No pertinent past medical history    Past Surgical History  Procedure Laterality Date  . Induced abortion    . No past surgeries     Family History: family history includes Asthma in her maternal grandmother; COPD in her maternal grandmother; Diabetes in her maternal  grandfather and maternal grandmother; Heart disease in her maternal grandmother; Hypertension in her mother. Social History:  reports that she has never smoked. She has never used smokeless tobacco. She reports that she does not drink alcohol or use illicit drugs.  Patient is married to [place], works as an Charity fundraiser.  Cares for son, Aurelio Brash, who is 10 years old Prenatal Transfer Tool  Maternal Diabetes: No Genetic Screening: Declined Maternal Ultrasounds/Referrals: Normal Fetal Ultrasounds or other Referrals:  None Maternal Substance Abuse:  No Significant Maternal Medications:  None Significant Maternal Lab Results: Lab values include: Group B Strep negative    ROS:  +Ctx, -LOF, -VB, +FM, -HA, -Visual Disturbances, -Epigastric Pain, +Swelling  Allergies  Allergen Reactions  . Penicillins Swelling      Blood pressure 137/92, pulse 100, height 5\' 5"  (1.651 m), weight 218 lb (98.884 kg), SpO2 98 %, currently breastfeeding.   FHR: 125 bpm, Mod Var, +Prolonged Decels, +Accels UCs:  Occasional  Prenatal labs: ABO, Rh:  O Positive Antibody:  Negative Rubella:   Immune RPR:   NR HBsAg:   Negative HIV:   Negative GBS:  Positive Sickle cell/Hgb electrophoresis:  Normal Pap:  Normal GC:  Negative Chlamydia:  Negative Genetic screenings:  None Glucola:  Normal Other:  06/02/14 PCR 0.09, AST 21, ALT 29, Plat 192.    Assessment IUP at 39.5wks Cat II FT Elevated BP IUGR H/O PreEclampsia IOL  Plan: Admit to  Birthing Suites per consult with Dr. Carmela Hurt Routine Labor and Delivery Orders per [facility] Protocol Position changes, fluid bolus to resolve Cat II FT Will perform physical exam once fetus stable  Kiven Vangilder LYNNCNM, MSN 06/17/2014, 5:34 PM

## 2014-06-17 NOTE — Progress Notes (Signed)
Subjective: Strip and Vitals Reviewed  Objective:  Filed Vitals:   06/17/14 1651 06/17/14 1731 06/17/14 1807 06/17/14 1924  BP:   131/84 137/96  Pulse: 100 105 98 101  Temp:    98.4 F (36.9 C)  TempSrc:    Oral  Resp:    18  Height:      Weight:      SpO2: 98% 99%      FHR: 125 bpm, Mod to Marked Var, +Variable Decels, +Accels UC: Occasional  Assessment: IUP at 74w5dwks Cat II FT Cervical Ripening PreEclampsia  Plan: Position change to resolve Cat II FT Continue other mgmt as ordered  Sabas Sous, CNM 06/17/2014 9:23 PM

## 2014-06-18 ENCOUNTER — Inpatient Hospital Stay (HOSPITAL_COMMUNITY): Payer: 59 | Admitting: Anesthesiology

## 2014-06-18 ENCOUNTER — Encounter (HOSPITAL_COMMUNITY)
Admission: AD | Disposition: A | Discharge status: Home / Self Care | Payer: Self-pay | Source: Ambulatory Visit | Attending: Obstetrics & Gynecology

## 2014-06-18 ENCOUNTER — Encounter (HOSPITAL_COMMUNITY): Payer: Self-pay | Admitting: *Deleted

## 2014-06-18 LAB — RPR: RPR: NONREACTIVE

## 2014-06-18 SURGERY — Surgical Case
Anesthesia: Regional

## 2014-06-18 MED ORDER — PHENYLEPHRINE 40 MCG/ML (10ML) SYRINGE FOR IV PUSH (FOR BLOOD PRESSURE SUPPORT)
PREFILLED_SYRINGE | INTRAVENOUS | Status: AC
  Filled 2014-06-18: qty 10

## 2014-06-18 MED ORDER — OXYCODONE-ACETAMINOPHEN 5-325 MG PO TABS: 2.0000 | ORAL_TABLET | ORAL | Status: DC | PRN

## 2014-06-18 MED ORDER — MORPHINE SULFATE 0.5 MG/ML IJ SOLN
INTRAMUSCULAR | Status: AC
  Filled 2014-06-18: qty 10

## 2014-06-18 MED ORDER — DIBUCAINE 1 % RE OINT: 1.0000 "application " | TOPICAL_OINTMENT | RECTAL | Status: DC | PRN

## 2014-06-18 MED ORDER — PHENYLEPHRINE 8 MG IN D5W 100 ML (0.08MG/ML) PREMIX OPTIME
INJECTION | INTRAVENOUS | Status: AC
  Filled 2014-06-18: qty 100

## 2014-06-18 MED ORDER — LACTATED RINGERS IV SOLN
INTRAVENOUS | Status: DC
  Administered 2014-06-18: 06:00:00 via INTRAUTERINE

## 2014-06-18 MED ORDER — SIMETHICONE 80 MG PO CHEW: 80.0000 mg | CHEWABLE_TABLET | ORAL | Status: DC | PRN

## 2014-06-18 MED ORDER — WITCH HAZEL-GLYCERIN EX PADS: 1.0000 "application " | MEDICATED_PAD | CUTANEOUS | Status: DC | PRN

## 2014-06-18 MED ORDER — TERBUTALINE SULFATE 1 MG/ML IJ SOLN
INTRAMUSCULAR | Status: AC
  Administered 2014-06-18: 0.25 mg
  Filled 2014-06-18: qty 1

## 2014-06-18 MED ORDER — IBUPROFEN 600 MG PO TABS
600.0000 mg | ORAL_TABLET | Freq: Four times a day (QID) | ORAL | Status: DC
  Administered 2014-06-18 – 2014-06-21 (×14): 600 mg via ORAL
  Filled 2014-06-18 (×14): qty 1

## 2014-06-18 MED ORDER — FENTANYL CITRATE 0.05 MG/ML IJ SOLN
INTRAMUSCULAR | Status: AC
  Filled 2014-06-18: qty 2

## 2014-06-18 MED ORDER — ZOLPIDEM TARTRATE 5 MG PO TABS: 5.0000 mg | ORAL_TABLET | Freq: Every evening | ORAL | Status: DC | PRN

## 2014-06-18 MED ORDER — BENZOCAINE-MENTHOL 20-0.5 % EX AERO
1.0000 "application " | INHALATION_SPRAY | CUTANEOUS | Status: DC | PRN
  Administered 2014-06-18: 1 via TOPICAL
  Filled 2014-06-18: qty 56

## 2014-06-18 MED ORDER — LANOLIN HYDROUS EX OINT: TOPICAL_OINTMENT | CUTANEOUS | Status: DC | PRN

## 2014-06-18 MED ORDER — DIPHENHYDRAMINE HCL 25 MG PO CAPS: 25.0000 mg | ORAL_CAPSULE | Freq: Four times a day (QID) | ORAL | Status: DC | PRN

## 2014-06-18 MED ORDER — SENNOSIDES-DOCUSATE SODIUM 8.6-50 MG PO TABS
2.0000 | ORAL_TABLET | ORAL | Status: DC
  Administered 2014-06-18: 2 via ORAL
  Filled 2014-06-18: qty 2

## 2014-06-18 MED ORDER — ONDANSETRON HCL 4 MG/2ML IJ SOLN: 4.0000 mg | INTRAMUSCULAR | Status: DC | PRN

## 2014-06-18 MED ORDER — TETANUS-DIPHTH-ACELL PERTUSSIS 5-2.5-18.5 LF-MCG/0.5 IM SUSP: 0.5000 mL | Freq: Once | INTRAMUSCULAR | Status: DC

## 2014-06-18 MED ORDER — ONDANSETRON HCL 4 MG/2ML IJ SOLN
INTRAMUSCULAR | Status: AC
  Filled 2014-06-18: qty 2

## 2014-06-18 MED ORDER — OXYCODONE-ACETAMINOPHEN 5-325 MG PO TABS: 1.0000 | ORAL_TABLET | ORAL | Status: DC | PRN

## 2014-06-18 MED ORDER — PRENATAL MULTIVITAMIN CH
1.0000 | ORAL_TABLET | Freq: Every day | ORAL | Status: DC
  Administered 2014-06-18 – 2014-06-20 (×3): 1 via ORAL
  Filled 2014-06-18 (×3): qty 1

## 2014-06-18 MED ORDER — OXYTOCIN 10 UNIT/ML IJ SOLN
INTRAMUSCULAR | Status: AC
  Filled 2014-06-18: qty 4

## 2014-06-18 MED ORDER — ONDANSETRON HCL 4 MG PO TABS: 4.0000 mg | ORAL_TABLET | ORAL | Status: DC | PRN

## 2014-06-18 SURGICAL SUPPLY — 32 items
BENZOIN TINCTURE PRP APPL 2/3 (GAUZE/BANDAGES/DRESSINGS) IMPLANT
CLAMP CORD UMBIL (MISCELLANEOUS) IMPLANT
CLOSURE WOUND 1/2 X4 (GAUZE/BANDAGES/DRESSINGS)
CLOTH BEACON ORANGE TIMEOUT ST (SAFETY) IMPLANT
CONTAINER PREFILL 10% NBF 15ML (MISCELLANEOUS) IMPLANT
DRAPE SHEET LG 3/4 BI-LAMINATE (DRAPES) IMPLANT
DRSG OPSITE POSTOP 4X10 (GAUZE/BANDAGES/DRESSINGS) IMPLANT
DURAPREP 26ML APPLICATOR (WOUND CARE) IMPLANT
ELECT REM PT RETURN 9FT ADLT (ELECTROSURGICAL)
ELECTRODE REM PT RTRN 9FT ADLT (ELECTROSURGICAL) IMPLANT
EXTRACTOR VACUUM M CUP 4 TUBE (SUCTIONS) IMPLANT
EXTRACTOR VACUUM M CUP 4' TUBE (SUCTIONS)
GLOVE BIO SURGEON STRL SZ7.5 (GLOVE) IMPLANT
GLOVE BIOGEL PI IND STRL 7.5 (GLOVE) IMPLANT
GLOVE BIOGEL PI INDICATOR 7.5 (GLOVE)
GOWN STRL REUS W/TWL LRG LVL3 (GOWN DISPOSABLE) IMPLANT
KIT ABG SYR 3ML LUER SLIP (SYRINGE) IMPLANT
NEEDLE HYPO 25X5/8 SAFETYGLIDE (NEEDLE) IMPLANT
NS IRRIG 1000ML POUR BTL (IV SOLUTION) IMPLANT
PACK C SECTION WH (CUSTOM PROCEDURE TRAY) IMPLANT
PAD OB MATERNITY 4.3X12.25 (PERSONAL CARE ITEMS) IMPLANT
RTRCTR C-SECT PINK 25CM LRG (MISCELLANEOUS) IMPLANT
STRIP CLOSURE SKIN 1/2X4 (GAUZE/BANDAGES/DRESSINGS) IMPLANT
SUT CHROMIC 2 0 CT 1 (SUTURE) IMPLANT
SUT MNCRL AB 3-0 PS2 27 (SUTURE) IMPLANT
SUT PLAIN 0 NONE (SUTURE) IMPLANT
SUT PLAIN 2 0 XLH (SUTURE) IMPLANT
SUT VIC AB 0 CT1 36 (SUTURE) IMPLANT
SUT VIC AB 0 CTX 36 (SUTURE)
SUT VIC AB 0 CTX36XBRD ANBCTRL (SUTURE) IMPLANT
TOWEL OR 17X24 6PK STRL BLUE (TOWEL DISPOSABLE) IMPLANT
TRAY FOLEY CATH 14FR (SET/KITS/TRAYS/PACK) IMPLANT

## 2014-06-18 NOTE — Progress Notes (Signed)
In to assess patient due to reports of Category II tracing. FHR with variable decels with contractions to the 70's at times with return to baseline. Moderate variability.  Accelerations are present.   Upon my arrival FHR is category I no decelerations currently moderate variability accelerations are present 15x15.  Toco: contractions every 4 minutes.   D/W pt plan of care. Will continue with Foley bulb. Once it has fallen out if contractions are not regular start pitocin 1x1.  If variables become  repetitive plan for ROM and placement of internals and amnioinfusion.   I discussed r/o cesarean section in the event that her baby does not tolerate labor and FHR becomes non-reassuring including but not limited to infection, bleeding , damage to bowel bladder baby with the need for further surgery.   Patient voiced understanding of her plan of care and is in agreement with expectant management now that FHR is reassuring.

## 2014-06-18 NOTE — Anesthesia Preprocedure Evaluation (Deleted)
Anesthesia Evaluation  Patient identified by MRN, date of birth, ID band Patient awake    Reviewed: Allergy & Precautions, H&P , NPO status , Patient's Chart, lab work & pertinent test results  Airway Mallampati: II       Dental   Pulmonary  breath sounds clear to auscultation        Cardiovascular Exercise Tolerance: Good hypertension, Rhythm:regular Rate:Normal     Neuro/Psych    GI/Hepatic   Endo/Other  Morbid obesity  Renal/GU      Musculoskeletal   Abdominal   Peds  Hematology   Anesthesia Other Findings   Reproductive/Obstetrics (+) Pregnancy                             Anesthesia Physical Anesthesia Plan  ASA: III and emergent  Anesthesia Plan: Spinal   Post-op Pain Management:    Induction:   Airway Management Planned:   Additional Equipment:   Intra-op Plan:   Post-operative Plan:   Informed Consent: I have reviewed the patients History and Physical, chart, labs and discussed the procedure including the risks, benefits and alternatives for the proposed anesthesia with the patient or authorized representative who has indicated his/her understanding and acceptance.     Plan Discussed with: Anesthesiologist, CRNA and Surgeon  Anesthesia Plan Comments:         Anesthesia Quick Evaluation

## 2014-06-18 NOTE — Lactation Note (Signed)
This note was copied from the chart of [place]. Lactation Consultation Note Initial visit at 69 hours of age.  Baby is 40w6dand 5#5oz.  Baby has formula supplementation early today for low blood sugar that has resolved.  Unable to express more than a drop with hand expression and concerned about need to supplement due to small size and feedings without swallows noted.  Mom plans to use her own DEBP medela and kit was provided.  Mom will pump for 15 minutes every 3 hours and offer EBM to baby.  Mom will supplement feedings with alimentum formula.  Mom plans to use bottles and nipples per her choice.  Assisted with latch in football hold on left breast.  Baby fussy and took several attempts to latch and then did so well, with wide flanged lips and rhythmic sucking for greater than 10 minutes.  Strong jaw excursions noted, but swallows not heard.  MBU RN aware of plan and will assist with progression of supplementation need and further instructions on pumping as needed.  [facility] given and discussed.  Encouraged to feed with early cues on demand.  Early newborn behavior discussed.  Hand expression demonstrated with colostrum visible.  Mom to call for assist as needed.    Patient Name: Cassie Lawrence Reason for consult: Initial assessment;Infant < 6lbs   Maternal Data Has patient been taught Hand Expression?: Yes Does the patient have breastfeeding experience prior to this delivery?: Yes  Feeding Feeding Type: Breast Fed Length of feed:  (Observed 10 minutes)  LATCH Score/Interventions Latch: Grasps breast easily, tongue down, lips flanged, rhythmical sucking.  Audible Swallowing: None  Type of Nipple: Flat (flatten some with compression) Intervention(s): Double electric pump;Hand pump  Comfort (Breast/Nipple): Soft / non-tender     Hold (Positioning): Assistance needed to correctly position infant at breast and maintain latch. Intervention(s):  Breastfeeding basics reviewed;Support Pillows;Position options;Skin to skin  LATCH Score: 6  Lactation Tools Discussed/Used Pump Review: Setup, frequency, and cleaning Initiated by:: JS Date initiated:: 06/18/14   Consult Status Consult Status: Follow-up Date: 06/19/14 Follow-up type: In-patient    Eduar Kumpf, JJustine Null2/25/2016, 11:01 PM

## 2014-06-18 NOTE — Progress Notes (Addendum)
Milliani Jory MRN: [id number]  Subjective: -Patient up to toilet.  Reports contractions and that foley bulb still in place. Coping well.   Objective: BP 129/89 mmHg  Pulse 102  Temp(Src) 98.4 F (36.9 C) (Oral)  Resp 18  Ht 5\' 5"  (1.651 m)  Wt 218 lb (98.884 kg)  BMI 36.28 kg/m2  SpO2 99%   Total I/O In: -  Out: 300 [Urine:300] FHT: 125  bpm, Mod Var, + Variable/Late Decels, +Accels UC:  Q4-60min, palpates moderate  SVE:   Dilation: 6 Effacement (%): 50 Exam by:: J.Emly CNM Membranes: AROM at  Pitocin: None Foley Bulb Removed IUPC and FSE Inserted  Assessment:  IUP at 39.6wks Cat II FT  Labor Augmentation IUGR  Plan: -Discussed recommendation for intrauterine resuscitative measures -IUPC and FSE placed by Dr. Delrae Sawyers -Amnioinfusion started: 300/100 -Position change to promote resolution of Cat II FT -Continue other mgmt as ordered  EMLY, JESSICA LYNN,MSN, CNM 06/18/2014, 6:05 AM   As above plan to monitor fetal heart rate closely FHR currently category II will reevaluate in 30 minutes.

## 2014-06-19 ENCOUNTER — Other Ambulatory Visit: Payer: Self-pay

## 2014-06-19 ENCOUNTER — Inpatient Hospital Stay (HOSPITAL_COMMUNITY): Payer: 59

## 2014-06-19 ENCOUNTER — Encounter (HOSPITAL_COMMUNITY): Payer: Self-pay

## 2014-06-19 DIAGNOSIS — R0600 Dyspnea, unspecified: Secondary | ICD-10-CM

## 2014-06-19 DIAGNOSIS — I5021 Acute systolic (congestive) heart failure: Secondary | ICD-10-CM

## 2014-06-19 DIAGNOSIS — O903 Peripartum cardiomyopathy: Secondary | ICD-10-CM

## 2014-06-19 LAB — BLOOD GAS, ARTERIAL
Acid-base deficit: 1.9 mmol/L (ref 0.0–2.0)
BICARBONATE: 22.3 meq/L (ref 20.0–24.0)
Drawn by: 40556
FIO2: 1 %
O2 Saturation: 94 %
PCO2 ART: 38 mmHg (ref 35.0–45.0)
PO2 ART: 58.6 mmHg — AB (ref 80.0–100.0)
TCO2: 23.4 mmol/L (ref 0–100)
pH, Arterial: 7.386 (ref 7.350–7.450)

## 2014-06-19 LAB — COMPREHENSIVE METABOLIC PANEL
ALBUMIN: 2.7 g/dL — AB (ref 3.5–5.2)
ALK PHOS: 197 U/L — AB (ref 39–117)
ALT: 38 U/L — AB (ref 0–35)
ALT: 41 U/L — ABNORMAL HIGH (ref 0–35)
ANION GAP: 3 — AB (ref 5–15)
AST: 42 U/L — ABNORMAL HIGH (ref 0–37)
AST: 48 U/L — ABNORMAL HIGH (ref 0–37)
Albumin: 2.7 g/dL — ABNORMAL LOW (ref 3.5–5.2)
Alkaline Phosphatase: 212 U/L — ABNORMAL HIGH (ref 39–117)
Anion gap: 4 — ABNORMAL LOW (ref 5–15)
BUN: 9 mg/dL (ref 6–23)
BUN: 8 mg/dL (ref 6–23)
CALCIUM: 8.5 mg/dL (ref 8.4–10.5)
CHLORIDE: 110 mmol/L (ref 96–112)
CO2: 22 mmol/L (ref 19–32)
CO2: 24 mmol/L (ref 19–32)
Calcium: 8.9 mg/dL (ref 8.4–10.5)
Chloride: 111 mmol/L (ref 96–112)
Creatinine, Ser: 0.61 mg/dL (ref 0.50–1.10)
Creatinine, Ser: 0.76 mg/dL (ref 0.50–1.10)
GFR calc Af Amer: >90 mL/min (ref >90)
GLUCOSE: 86 mg/dL (ref 70–99)
GLUCOSE: 92 mg/dL (ref 70–99)
POTASSIUM: 3.9 mmol/L (ref 3.5–5.1)
Potassium: 3.8 mmol/L (ref 3.5–5.1)
Sodium: 137 mmol/L (ref 135–145)
Sodium: 137 mmol/L (ref 135–145)
Total Bilirubin: 0.4 mg/dL (ref 0.3–1.2)
Total Bilirubin: 0.3 mg/dL (ref 0.3–1.2)
Total Protein: 6.3 g/dL (ref 6.0–8.3)
Total Protein: 6.5 g/dL (ref 6.0–8.3)

## 2014-06-19 LAB — CBC
HCT: 39.2 % (ref 36.0–46.0)
HCT: 38 % (ref 36.0–46.0)
Hemoglobin: 13.4 g/dL (ref 12.0–15.0)
Hemoglobin: 12.8 g/dL (ref 12.0–15.0)
MCH: 31.3 pg (ref 26.0–34.0)
MCH: 30.8 pg (ref 26.0–34.0)
MCHC: 34.2 g/dL (ref 30.0–36.0)
MCHC: 33.7 g/dL (ref 30.0–36.0)
MCV: 91.6 fL (ref 78.0–100.0)
MCV: 91.3 fL (ref 78.0–100.0)
PLATELETS: 166 10*3/uL (ref 150–400)
PLATELETS: 158 10*3/uL (ref 150–400)
RBC: 4.28 MIL/uL (ref 3.87–5.11)
RBC: 4.16 MIL/uL (ref 3.87–5.11)
RDW: 13.6 % (ref 11.5–15.5)
RDW: 13.7 % (ref 11.5–15.5)
WBC: 14 10*3/uL — ABNORMAL HIGH (ref 4.0–10.5)
WBC: 11.1 10*3/uL — ABNORMAL HIGH (ref 4.0–10.5)

## 2014-06-19 LAB — URIC ACID
URIC ACID, SERUM: 5.7 mg/dL (ref 2.4–7.0)
Uric Acid, Serum: 6.2 mg/dL (ref 2.4–7.0)

## 2014-06-19 LAB — PROTEIN / CREATININE RATIO, URINE
CREATININE, URINE: 119 mg/dL
PROTEIN CREATININE RATIO: 0.53 — AB (ref 0.00–0.15)
TOTAL PROTEIN, URINE: 63 mg/dL

## 2014-06-19 LAB — LACTATE DEHYDROGENASE
LDH: 249 U/L (ref 94–250)
LDH: 249 U/L (ref 94–250)

## 2014-06-19 LAB — MRSA PCR SCREENING: MRSA by PCR: NEGATIVE

## 2014-06-19 MED ORDER — CARVEDILOL 3.125 MG PO TABS
6.2500 mg | ORAL_TABLET | Freq: Two times a day (BID) | ORAL | Status: DC
  Administered 2014-06-20 (×2): 6.25 mg via ORAL
  Filled 2014-06-19 (×3): qty 2

## 2014-06-19 MED ORDER — FUROSEMIDE 10 MG/ML IJ SOLN
10.0000 mg | Freq: Once | INTRAMUSCULAR | Status: AC
Start: 2014-06-19 — End: 2014-06-19
  Administered 2014-06-19: 10 mg via INTRAVENOUS
  Filled 2014-06-19: qty 2

## 2014-06-19 MED ORDER — LABETALOL HCL 100 MG PO TABS
100.0000 mg | ORAL_TABLET | Freq: Two times a day (BID) | ORAL | Status: DC
  Administered 2014-06-19: 100 mg via ORAL
  Filled 2014-06-19 (×3): qty 1

## 2014-06-19 MED ORDER — CARVEDILOL 3.125 MG PO TABS: 3.1250 mg | ORAL_TABLET | Freq: Two times a day (BID) | ORAL | Status: DC

## 2014-06-19 MED ORDER — FUROSEMIDE 20 MG PO TABS
20.0000 mg | ORAL_TABLET | Freq: Every day | ORAL | Status: DC
Start: 2014-06-19 — End: 2014-06-21
  Administered 2014-06-19 – 2014-06-21 (×3): 20 mg via ORAL
  Filled 2014-06-19 (×3): qty 1

## 2014-06-19 MED ORDER — FUROSEMIDE 10 MG/ML IJ SOLN
10.0000 mg | Freq: Once | INTRAMUSCULAR | Status: AC
Start: 2014-06-19 — End: 2014-06-19
  Administered 2014-06-19: 10 mg via INTRAVENOUS
  Filled 2014-06-19 (×2): qty 1

## 2014-06-19 MED ORDER — HYDRALAZINE HCL 20 MG/ML IJ SOLN
5.0000 mg | Freq: Once | INTRAMUSCULAR | Status: AC
  Administered 2014-06-19: 5 mg via INTRAVENOUS
  Filled 2014-06-19: qty 1

## 2014-06-19 MED ORDER — LISINOPRIL 10 MG PO TABS
10.0000 mg | ORAL_TABLET | Freq: Every day | ORAL | Status: DC
  Administered 2014-06-20 – 2014-06-21 (×2): 10 mg via ORAL
  Filled 2014-06-19 (×4): qty 1

## 2014-06-19 MED ORDER — ALBUTEROL SULFATE (2.5 MG/3ML) 0.083% IN NEBU
2.5000 mg | INHALATION_SOLUTION | Freq: Once | RESPIRATORY_TRACT | Status: AC
  Administered 2014-06-19: 2.5 mg via RESPIRATORY_TRACT

## 2014-06-19 MED ORDER — SPIRONOLACTONE 25 MG PO TABS
12.5000 mg | ORAL_TABLET | Freq: Every day | ORAL | Status: DC
  Administered 2014-06-20 – 2014-06-21 (×2): 12.5 mg via ORAL
  Filled 2014-06-19 (×3): qty 0.5

## 2014-06-19 MED ORDER — IOHEXOL 350 MG/ML SOLN
100.0000 mL | Freq: Once | INTRAVENOUS | Status: AC | PRN
  Administered 2014-06-19: 100 mL via INTRAVENOUS

## 2014-06-19 NOTE — Progress Notes (Addendum)
Post Partum Day 1 from a VAVD  Subjective: CTSP secondary to acute SOB, tachypnea and tachycardia.  CNM already in room and ABG and breathing treatment ordered.  Objective: Blood pressure 141/106, pulse 93, temperature 97.3 F (36.3 C), temperature source Axillary, resp. rate 40, height 5\' 5"  (1.651 m), weight 98.884 kg (218 lb), SpO2 93 %, unknown if currently breastfeeding.  10L O2 via face mask.  Physical Exam:  General: alert and moderate distress with obvious increased respiratory effort Lungs crackles midway up back CV RRR Lochia: appropriate Uterine Fundus: firm and NT DVT Evaluation: No evidence of DVT seen on physical exam.   Recent Labs  06/17/14 1655 06/19/14 0325  HGB 13.3 13.4  HCT 39.0 39.2    Assessment/Plan: VAVD #1 with acute SOB and mildly elevated BPs.  Pt with likely pulmonary edema from preeclampsia.  Labs ordered.  Will also get CT Scan to eval for PE and cancel chest xray.  LFTs were mildly elevated on admission with only mildly elevated BPs and urine PCR of 0.17.  Will place foley for strict I's/O's.  10mg  IV lasix ordered now.  Will transfer to AICU for closer observation.   LOS: 2 days   Cassie Lawrence Y 06/19/2014, 3:38 AM

## 2014-06-19 NOTE — Progress Notes (Signed)
UR chart review completed.  

## 2014-06-19 NOTE — Progress Notes (Signed)
Patient ID: Cassie Lawrence, female   DOB: 1985-09-06, 29 y.o.   MRN: [id number] Post Partum Day1 Subjective: no complaints, voiding and tolerating PO and breast feeding.  Pt still has some SOB  Objective: Afebrile VSS  Physical Exam:  General: alert Lochia: appropriate Uterine Fundus: firm CV RR Lungs CTA B  DVT Evaluation: No evidence of DVT seen on physical exam.   Recent Labs  06/19/14 0325 06/19/14 1215  HGB 13.4 12.8  HCT 39.2 38.0    Assessment/Plan: PPD 1 with Preeclampsia.  Pt had pulmonary edema now resolve.  Still with some SOB will do echo.    Pt breast feeding Continue current  care   LOS: 2 days   Emmanuel Gruenhagen A 06/19/2014, 2:18 PM

## 2014-06-19 NOTE — Progress Notes (Signed)
Pt called nurse to the room, upon arrival pt c/o shortness of breath and was observed diaphoretic with increased work of breathing. Pt lung sounds + for crackles. Charge nurse notified, assessment performed, significant for increased resp, HR and BP. Afebrile. Pt placed on 10 lpm 02, and repositioned to seated in the beRapid response called and arrived at 03:10. Charge nurse Leavy Cella, RN notified Sherre Scarlet, CNM and was at the bedside to evaluate at 03:15. Respiratory was at the bedside and drew ABG at 03:20. Dr. Osborn Coho at the bedside to evaluate at 03:24. 18g IV to right hand started by Domenick Bookbinder, RN. Neb tx given by respiratory therapy. Spiral CT ordered by Dr. Su Hilt and Lasix 10mg  given at 03:41 via IV. Foley catheter inserted by Leavy Cella, RN at 03:40, UA specimen drawn and taken to lab. Dr. Su Hilt ordered transfer to [facility], report to Gloris Manchester, RN and care assumed by her. Sherald Barge

## 2014-06-19 NOTE — Significant Event (Signed)
Rapid Response Event Note  Overview: Time Called: 0305 Arrival Time: 0310 Event Type: Respiratory  Initial Focused Assessment:   Interventions:   Event Summary: Name of Physician Notified: Dr. Osborn Coho at [phone]    at    Outcome: Transferred (Comment)  Event End Time: 8280  Cassie Lawrence

## 2014-06-19 NOTE — Progress Notes (Signed)
  Echocardiogram 2D Echocardiogram has been performed.  Cathie Beams 06/19/2014, 4:12 PM

## 2014-06-19 NOTE — Progress Notes (Signed)
Echo department at [facility] notified of 2D Echo order.  Stated will send someone from [facility] or [facility] to perform the test.

## 2014-06-19 NOTE — Consult Note (Addendum)
Referring Physician: Dr. Ancil Boozer Reason for Consultation: Acute HF  HPI:  29 y/o morbidly obese woman with no significant PMH. Whom we are asked to see due to acute HF in setting of peri-partum CM.   She had her first child 2 years ago without difficulty. During this pregnancy developed some SOB and edema starting about 5-6 months but this waxed and waned. Over last week or two BP has been slightly high 120-130/90s. She was seen in clinic and u/s showed IUGR with reduced amniotic fluid so she was taken for vaginal deliver on 06/18/14.  This am tachypneic and tachycardic with acute SOB. Chest CT suggestive of CHF. Echo with mildly dilated LV. EF 20-25%. We are consulted for management of acute HF.   She has received two doses of IV lasix with over 3L out. Now feels much better. Denies dyspnea. Currently breast feeding. HR 90s. BP 130/80. No FHx of familial CM   Review of Systems:     Cardiac Review of Systems: {Y] = yes  = no  Chest Pain [    ]  Resting SOB [  y ] Exertional SOB  [  ]  Orthopnea [  ]   Pedal Edema [   ]    Palpitations [  ] Syncope  [  ]   Presyncope [   ]  General Review of Systems: [Y] = yes [  ]=no Constitional: recent weight change [  ]; anorexia [  ]; fatigue [  ]; nausea [  ]; night sweats [  ]; fever [  ]; or chills [  ];                 Eyes : blurred vision [  ]; diplopia [   ]; vision changes [  ];  Amaurosis fugax[  ]; Resp: cough [  ];  wheezing[  ];  hemoptysis[  ];  PND [  ];  GI:  gallstones[  ], vomiting[  ];  dysphagia[  ]; melena[  ];  hematochezia [  ]; heartburn[  ];   GU: kidney stones [  ]; hematuria[  ];   dysuria [  ];  nocturia[  ]; incontinence [  ];             Skin: rash, swelling[  ];, hair loss[  ];  peripheral edema[  ];  or itching[  ]; Musculosketetal: myalgias[  ];  joint swelling[  ];  joint erythema[  ];  joint pain[  ];  back pain[  ];  Heme/Lymph: bruising[  ];  bleeding[  ];  anemia[  ];  Neuro: TIA[  ];  headaches[  ];   stroke[  ];  vertigo[  ];  seizures[  ];   paresthesias[  ];  difficulty walking[  ];  Psych:depression[  ]; anxiety[  ];  Endocrine: diabetes[  ];  thyroid dysfunction[  ];  Other:  Past Medical History  Diagnosis Date  . H/O chlamydia infection   . History of gonorrhea   . Trichomonas infection   . H/O candidiasis   . H/O varicella   . No pertinent past medical history     Medications Prior to Admission  Medication Sig Dispense Refill  . acetaminophen (TYLENOL) 500 MG tablet Take 500 mg by mouth every 6 (six) hours as needed for headache.    . calcium carbonate (TUMS - DOSED IN MG ELEMENTAL CALCIUM) 500 MG chewable tablet Chew 2 tablets by mouth daily as  needed for indigestion or heartburn.    . Prenatal Vit-Fe Fumarate-FA (PRENATAL MULTIVITAMIN) TABS Take 1 tablet by mouth daily.    . norethindrone (ORTHO MICRONOR) 0.35 MG tablet Take 1 tablet (0.35 mg total) by mouth daily. 1 Package 11     . ibuprofen  600 mg Oral 4 times per day  . labetalol  100 mg Oral BID  . prenatal multivitamin  1 tablet Oral Q1200  . senna-docusate  2 tablet Oral Q24H    Infusions:    Allergies  Allergen Reactions  . Penicillins Swelling    History   Social History  . Marital Status: Single    Spouse Name: N/A  . Number of Children: N/A  . Years of Education: N/A   Occupational History  . Not on file.   Social History Main Topics  . Smoking status: Never Smoker   . Smokeless tobacco: Never Used  . Alcohol Use: No  . Drug Use: No  . Sexual Activity: Not on file   Other Topics Concern  . Not on file   Social History Narrative    Family History  Problem Relation Age of Onset  . Hypertension Mother   . Heart disease Maternal Grandmother   . Asthma Maternal Grandmother   . COPD Maternal Grandmother   . Diabetes Maternal Grandmother   . Diabetes Maternal Grandfather     PHYSICAL EXAM: Filed Vitals:   06/19/14 1716  BP:   Pulse:   Temp: 98.7 F (37.1 C)  Resp: 22      Intake/Output Summary (Last 24 hours) at 06/19/14 1724 Last data filed at 06/19/14 1520  Gross per 24 hour  Intake    300 ml  Output   2960 ml  Net  -2660 ml    General:  Well appearing. No respiratory difficulty HEENT: normal Neck: supple. JVP 6 Carotids 2+ bilat; no bruits. No lymphadenopathy or thryomegaly appreciated. Cor: PMI nondisplaced. Regular rate & rhythm. No rubs, gallops or murmurs. Lungs: clear Abdomen: soft, nontender, nondistended. No hepatosplenomegaly. No bruits or masses. Good bowel sounds. Extremities: no cyanosis, clubbing, rash, tr edema Neuro: alert & oriented x 3, cranial nerves grossly intact. moves all 4 extremities w/o difficulty. Affect pleasant.  ECG: pending  Results for orders placed or performed during the hospital encounter of 06/17/14 (from the past 24 hour(s))  Blood gas, arterial     Status: Abnormal   Collection Time: 06/19/14  3:17 AM  Result Value Ref Range   FIO2 1.00 %   Delivery systems SIMPLE MASK    pH, Arterial 7.386 7.350 - 7.450   pCO2 arterial 38.0 35.0 - 45.0 mmHg   pO2, Arterial 58.6 (L) 80.0 - 100.0 mmHg   Bicarbonate 22.3 20.0 - 24.0 mEq/L   TCO2 23.4 0 - 100 mmol/L   Acid-base deficit 1.9 0.0 - 2.0 mmol/L   O2 Saturation 94.0 %   Collection site RADIAL    Drawn by [phone]    Sample type ARTERIAL    Allens test (pass/fail) PASS PASS  CBC     Status: Abnormal   Collection Time: 06/19/14  3:25 AM  Result Value Ref Range   WBC 14.0 (H) 4.0 - 10.5 K/uL   RBC 4.28 3.87 - 5.11 MIL/uL   Hemoglobin 13.4 12.0 - 15.0 g/dL   HCT 60.4 54.0 - 98.1 %   MCV 91.6 78.0 - 100.0 fL   MCH 31.3 26.0 - 34.0 pg   MCHC 34.2 30.0 - 36.0 g/dL  RDW 13.6 11.5 - 15.5 %   Platelets 166 150 - 400 K/uL  Comprehensive metabolic panel     Status: Abnormal   Collection Time: 06/19/14  3:25 AM  Result Value Ref Range   Sodium 137 135 - 145 mmol/L   Potassium 3.9 3.5 - 5.1 mmol/L   Chloride 111 96 - 112 mmol/L   CO2 22 19 - 32 mmol/L    Glucose, Bld 86 70 - 99 mg/dL   BUN 9 6 - 23 mg/dL   Creatinine, Ser 3.66 0.50 - 1.10 mg/dL   Calcium 8.9 8.4 - 29.4 mg/dL   Total Protein 6.3 6.0 - 8.3 g/dL   Albumin 2.7 (L) 3.5 - 5.2 g/dL   AST 42 (H) 0 - 37 U/L   ALT 38 (H) 0 - 35 U/L   Alkaline Phosphatase 212 (H) 39 - 117 U/L   Total Bilirubin 0.4 0.3 - 1.2 mg/dL   GFR calc non Af Amer >90 >90 mL/min   GFR calc Af Amer >90 >90 mL/min   Anion gap 4 (L) 5 - 15  Lactate dehydrogenase     Status: None   Collection Time: 06/19/14  3:25 AM  Result Value Ref Range   LDH 249 94 - 250 U/L  Uric acid     Status: None   Collection Time: 06/19/14  3:25 AM  Result Value Ref Range   Uric Acid, Serum 5.7 2.4 - 7.0 mg/dL  Protein / creatinine ratio, urine     Status: Abnormal   Collection Time: 06/19/14  3:40 AM  Result Value Ref Range   Creatinine, Urine 119.00 mg/dL   Total Protein, Urine 63 mg/dL   Protein Creatinine Ratio 0.53 (H) 0.00 - 0.15  MRSA PCR Screening     Status: None   Collection Time: 06/19/14  4:30 AM  Result Value Ref Range   MRSA by PCR NEGATIVE NEGATIVE  CBC     Status: Abnormal   Collection Time: 06/19/14 12:15 PM  Result Value Ref Range   WBC 11.1 (H) 4.0 - 10.5 K/uL   RBC 4.16 3.87 - 5.11 MIL/uL   Hemoglobin 12.8 12.0 - 15.0 g/dL   HCT 76.5 46.5 - 03.5 %   MCV 91.3 78.0 - 100.0 fL   MCH 30.8 26.0 - 34.0 pg   MCHC 33.7 30.0 - 36.0 g/dL   RDW 46.5 68.1 - 27.5 %   Platelets 158 150 - 400 K/uL  Comprehensive metabolic panel     Status: Abnormal   Collection Time: 06/19/14 12:15 PM  Result Value Ref Range   Sodium 137 135 - 145 mmol/L   Potassium 3.8 3.5 - 5.1 mmol/L   Chloride 110 96 - 112 mmol/L   CO2 24 19 - 32 mmol/L   Glucose, Bld 92 70 - 99 mg/dL   BUN 8 6 - 23 mg/dL   Creatinine, Ser 1.70 0.50 - 1.10 mg/dL   Calcium 8.5 8.4 - 01.7 mg/dL   Total Protein 6.5 6.0 - 8.3 g/dL   Albumin 2.7 (L) 3.5 - 5.2 g/dL   AST 48 (H) 0 - 37 U/L   ALT 41 (H) 0 - 35 U/L   Alkaline Phosphatase 197 (H) 39 - 117 U/L    Total Bilirubin 0.3 0.3 - 1.2 mg/dL   GFR calc non Af Amer >90 >90 mL/min   GFR calc Af Amer >90 >90 mL/min   Anion gap 3 (L) 5 - 15  Uric acid     Status:  None   Collection Time: 06/19/14 12:15 PM  Result Value Ref Range   Uric Acid, Serum 6.2 2.4 - 7.0 mg/dL  Lactate dehydrogenase     Status: None   Collection Time: 06/19/14 12:15 PM  Result Value Ref Range   LDH 249 94 - 250 U/L   Ct Angio Chest Pe W/cm &/or Wo Cm  06/19/2014   CLINICAL DATA:  Shortness of breath, tachypnea. Crackles. Recent postpartum.  EXAM: CT ANGIOGRAPHY CHEST WITH CONTRAST  TECHNIQUE: Multidetector CT imaging of the chest was performed using the standard protocol during bolus administration of intravenous contrast. Multiplanar CT image reconstructions and MIPs were obtained to evaluate the vascular anatomy.  CONTRAST:  OMNIPAQUE IOHEXOL 350 MG/ML SOLN  COMPARISON:  None.  FINDINGS: There are no filling defects within the pulmonary arteries to suggest pulmonary embolus.  The thoracic aorta is normal in caliber. There is no evidence of dissection. Heart at the upper limits of normal in size. Small bilateral pleural effusions, right greater than left. No pericardial effusion. No mediastinal or hilar adenopathy.  There multi focal bilateral fluffy and ground-glass opacities, lower lobe and basilar predominant distribution. More confluent opacities are seen within the dependent bilateral lower lobes. There is smooth septal thickening. Motion artifact seen at the lung bases.  No acute abnormality in the included upper abdomen. No osseous abnormalities.  Review of the MIP images confirms the above findings.  IMPRESSION: 1. No pulmonary embolus. 2. Small bilateral pleural effusions, right greater than left. Findings suggesting pulmonary edema. More confluent lower lobe opacities are likely confluent edema, less likely pneumonia or aspiration.   Electronically Signed   By: Rubye Oaks M.D.   On: 06/19/2014 05:38      ASSESSMENT: 1. 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 Acute respiratory failure improved 3. Day #1 s/p Vaginal delivery 2/25  4. 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 Volume status now much better after 2 doses of IV lasix. I had a long talk with her and her husband about the etiology and natural history of peripartum CM. I told them that it typically has very good prognosis but occasionally can progress to advanced HF. We discussed treatment with ACE-I, b-blockers and diuretics. I also told her that the guidelines favor stopping breastfeeding as ACE-I are contraindicated in this setting. She is willing to stop breast feeding and I discussed this with Dr. Normand Sloop. We also discussed the need for contraception (preferably IUD) post-partum to avoid recurrence or worsening HF.   Plan: 1) Change labetalol to carvedilol 6.25 bid. Titrate as tolerated.  2) Start lasix 20 mg po daily. Can give extra lasix as needed 3) Start lisinopril 10 mg daily and spiro 12.5 daily once she stops breastfeeding (ideally tonight or tomorrow) 4) Keep in ICU on tele for at least another 24 hours.  5) Daily BMET to follow renal function and potassium 6) Check TSH  We will follow.   Daniel Bensimhon,MD 7:01 PM

## 2014-06-19 NOTE — Lactation Note (Signed)
This note was copied from the chart of Cassie Lawrence. Lactation Consultation Note     Mom and baby now 29 hours post partum. Baby was latched deeply with strong, rhythmic swallows when I walked into the room. He is only 5 lbs 3.8 oz, lost 1.7% at 16 hours of life. On exam, mom has tiny drops of colostrum. He has been fed some formula. He voided once over 24 hours ago, only time since birth, and has had 5 stools. I advised mom to begin supplementing him with formula about every 3 hours, unless he begins either refusing formula(mom's milk in), or putting out frequent urine. I reviewed the amounts of voids and dirty's baby should have, and advised mom to offer baby up to 20 mls per feeding today. I gave mom a foley cup to try, since she would prefer he not use a nipple. Mom very receptive to teaching. She is still in AICU  , on oxygen, pulmonary edema. Mom has received lasix a couple of times also.  Mom knows t call for lactation has needed.  Patient Name: Cassie Lawrence JSEGB'T Date: 06/19/2014 Reason for consult: Follow-up assessment   Maternal Data    Feeding Feeding Type: Formula Length of feed:  (in progress)  LATCH Score/Interventions Latch: Grasps breast easily, tongue down, lips flanged, rhythmical sucking.  Audible Swallowing: None Intervention(s): Skin to skin;Hand expression  Type of Nipple: Everted at rest and after stimulation  Comfort (Breast/Nipple): Soft / non-tender     Hold (Positioning): No assistance needed to correctly position infant at breast. Intervention(s): Breastfeeding basics reviewed;Support Pillows;Position options;Skin to skin  LATCH Score: 8  Lactation Tools Discussed/Used     Consult Status Consult Status: Follow-up Date: 06/20/14 Follow-up type: In-patient    Alfred Levins 06/19/2014, 1:00 PM

## 2014-06-19 NOTE — Significant Event (Signed)
Rapid Response Event Note  Overview: Time Called: 0305 Arrival Time: 0310 Event Type: Respiratory  Initial Focused Assessment: Arrived in room to find patient sitting up in bed, O2 per FM at 10L with sats 94%.  .  C/O SOB, pressure in chest - sudden onset approx 15 min ago.  VS:  BP 141/106, P_120, Resp-40 shallow, T - 97.3.  Crackles noted bilaterally LL's.     Interventions:  Thornell Mule, CNM and Dr. Su Hilt in room and orders reviewed.  IV access obtained, given IV Lasix, foley catheter inserted, labs drawn, neb tx per respiratory, ABG's drawn.  Spiral CT ordered.  Orders to transfer pt to AICU for close monitoring.     Event Summary:  Pt. transferred to AICU at 0400.   Remains on O2 but states feeling much better.   See initial AICU assessment, VS.     at      at          [facility], [place]

## 2014-06-20 LAB — BASIC METABOLIC PANEL
Anion gap: 3 — ABNORMAL LOW (ref 5–15)
BUN: 11 mg/dL (ref 6–23)
CALCIUM: 8.1 mg/dL — AB (ref 8.4–10.5)
CHLORIDE: 107 mmol/L (ref 96–112)
CO2: 23 mmol/L (ref 19–32)
CREATININE: 0.73 mg/dL (ref 0.50–1.10)
GFR calc Af Amer: >90 mL/min (ref >90)
GFR calc non Af Amer: >90 mL/min (ref >90)
Glucose, Bld: 77 mg/dL (ref 70–99)
Potassium: 3.6 mmol/L (ref 3.5–5.1)
Sodium: 133 mmol/L — ABNORMAL LOW (ref 135–145)

## 2014-06-20 LAB — TSH: TSH: 3.247 u[IU]/mL (ref 0.350–4.500)

## 2014-06-20 LAB — BRAIN NATRIURETIC PEPTIDE: B NATRIURETIC PEPTIDE 5: 997.7 pg/mL — AB (ref 0.0–100.0)

## 2014-06-20 NOTE — Lactation Note (Signed)
This note was copied from the chart of Cassie Jaleyza Hugues. Lactation Consultation Note  Mom's cardiologist's plan was to consult with neonatologist about the safety of maternal meds and BF.  Cardiologist did not make any note that lactating was contraindicated for the mother.  Lactation Plan is to initiate pumping and saving the milk until there is a more definitive answer about safety of milk for the baby.  The medications in question all have molecular weights >200. Which are considered high molecular weights according to [facility].  Drugs with higher molecular weights must traverse the membrane by dissolving into the cells lipid membranes, which may significantly reduce milk levels [employer] 2014).  The medications are Lisinopril (L3), an ACE inhibitor and per [employer] ACE inhibitors in general do not transfer into human milk significantly. Coreg (L3),Aldactone (L2), and Lasix (L3)per [employer] the oral bio-availablity of lasix is very poor but is may reduce mom's milk supply.  I gave mom information from [place] to read so that she could make an informed decision and suggested that she discuss this with the baby's pediatrician and lactation tomorrow. I left this medication information in the nursery for the pediatrician on call tomorrow.  Patient Name: Cassie Lawrence [id number]'E Date: 06/20/2014     Maternal Data    Feeding Feeding Type: Bottle Fed - Formula Nipple Type: Slow - flow  LATCH Score/Interventions                      Lactation Tools Discussed/Used     Consult Status      Soyla Dryer 06/20/2014, 6:23 PM

## 2014-06-20 NOTE — Progress Notes (Addendum)
Postpartum day #2, NSVD Postpartum cardiomyopathy  Subjective Pt with complaint of pain lateral to right breast and right shoulder pain.  Feels it is musculoskeletal. Denies SOB or difficulty breathing.  Lochia normal.  Pain controlled.  Breast feeding yes  Pt has a strong desire to breast feed.  I spoke with Dr. Katrinka Blazing, neonatologist, he feel that Lisinopril would be probably be same.  Per his source, it can cause baby to be drowsy or effect feeding.  Not recommended in severely premature infants, which her baby is not.  Dr. Eric Form overnight felt sprionolactone was safe b/c it is prescribed in babies.  After discussing this information, and stressing that there is not a 100% answer on harm to the baby, pt desires to breast feed.  Temp:  [98.3 F (36.8 C)-98.8 F (37.1 C)] 98.8 F (37.1 C) (02/27 0754) Pulse Rate:  [96-113] 96 (02/27 1122) Resp:  [16-31] 20 (02/27 1122) BP: (112-143)/(68-106) 115/82 mmHg (02/27 1122) SpO2:  [94 %-100 %] 99 % (02/27 1122) Weight:  [94.405 kg (208 lb 2 oz)] 94.405 kg (208 lb 2 oz) (02/27 0555)  Gen:  NAD, A&O x 3 CV:  Tachycardia Lungs:  CTA bilaterally Uterine fundus:  Firm, nontender Ext:  2-3+Edema, no calf tenderness bilaterally  CBC    Component Value Date/Time   WBC 11.1* 06/19/2014 1215   RBC 4.16 06/19/2014 1215   HGB 12.8 06/19/2014 1215   HCT 38.0 06/19/2014 1215   PLT 158 06/19/2014 1215   MCV 91.3 06/19/2014 1215   MCH 30.8 06/19/2014 1215   MCHC 33.7 06/19/2014 1215   RDW 13.7 06/19/2014 1215     A/P: S/p SVD  Postpartum cardiomyopathy on Carvedilol, Spironolactone and now Lisinopril.  Cardiology following. Tachycardia-intermittent.  Cardiology to address. Continue routine postpartum care. Lactation support. Per RN, cardiology may recommend discharge today.  In that new medication started today, would prefer to observe pt and discharge in am given the acuity of the pt's condition.  Teejay Meader 06/20/2014, 1:30 PM

## 2014-06-20 NOTE — Progress Notes (Signed)
Subjective: No CP Breathing is improved from yesterday  Hasnt walked much   Objective: Filed Vitals:   06/20/14 0500 06/20/14 0555 06/20/14 0600 06/20/14 0754  BP:  112/68  124/84  Pulse:  113  104  Temp:  98.6 F (37 C)  98.8 F (37.1 C)  TempSrc:  Oral  Oral  Resp: 31 25 26 20   Height:      Weight:  208 lb 2 oz (94.405 kg)    SpO2:  98%  98%   Weight change: -7 lb 7.6 oz (-3.391 kg)  Intake/Output Summary (Last 24 hours) at 06/20/14 9628 Last data filed at 06/20/14 0900  Gross per 24 hour  Intake   1900 ml  Output   1660 ml  Net    240 ml    General: Alert, awake, oriented x3, in no acute distress Neck:  Neck full No obvious JVD   Heart: Regular rate and rhythm, without murmurs + S3   Lungs: Clear to auscultation.  No rales or wheezes. Exemities:  1+ edema.   Neuro: Grossly intact, nonfocal.  Tele:  SR/ST   Lab Results: Results for orders placed or performed during the hospital encounter of 06/17/14 (from the past 24 hour(s))  CBC     Status: Abnormal   Collection Time: 06/19/14 12:15 PM  Result Value Ref Range   WBC 11.1 (H) 4.0 - 10.5 K/uL   RBC 4.16 3.87 - 5.11 MIL/uL   Hemoglobin 12.8 12.0 - 15.0 g/dL   HCT 36.6 29.4 - 76.5 %   MCV 91.3 78.0 - 100.0 fL   MCH 30.8 26.0 - 34.0 pg   MCHC 33.7 30.0 - 36.0 g/dL   RDW 46.5 03.5 - 46.5 %   Platelets 158 150 - 400 K/uL  Comprehensive metabolic panel     Status: Abnormal   Collection Time: 06/19/14 12:15 PM  Result Value Ref Range   Sodium 137 135 - 145 mmol/L   Potassium 3.8 3.5 - 5.1 mmol/L   Chloride 110 96 - 112 mmol/L   CO2 24 19 - 32 mmol/L   Glucose, Bld 92 70 - 99 mg/dL   BUN 8 6 - 23 mg/dL   Creatinine, Ser 6.81 0.50 - 1.10 mg/dL   Calcium 8.5 8.4 - 27.5 mg/dL   Total Protein 6.5 6.0 - 8.3 g/dL   Albumin 2.7 (L) 3.5 - 5.2 g/dL   AST 48 (H) 0 - 37 U/L   ALT 41 (H) 0 - 35 U/L   Alkaline Phosphatase 197 (H) 39 - 117 U/L   Total Bilirubin 0.3 0.3 - 1.2 mg/dL   GFR calc non Af Amer >90 >90 mL/min     GFR calc Af Amer >90 >90 mL/min   Anion gap 3 (L) 5 - 15  Uric acid     Status: None   Collection Time: 06/19/14 12:15 PM  Result Value Ref Range   Uric Acid, Serum 6.2 2.4 - 7.0 mg/dL  Lactate dehydrogenase     Status: None   Collection Time: 06/19/14 12:15 PM  Result Value Ref Range   LDH 249 94 - 250 U/L  Basic metabolic panel     Status: Abnormal   Collection Time: 06/20/14  5:34 AM  Result Value Ref Range   Sodium 133 (L) 135 - 145 mmol/L   Potassium 3.6 3.5 - 5.1 mmol/L   Chloride 107 96 - 112 mmol/L   CO2 23 19 - 32 mmol/L   Glucose, Bld 77 70 -  99 mg/dL   BUN 11 6 - 23 mg/dL   Creatinine, Ser 4.54 0.50 - 1.10 mg/dL   Calcium 8.1 (L) 8.4 - 10.5 mg/dL   GFR calc non Af Amer >90 >90 mL/min   GFR calc Af Amer >90 >90 mL/min   Anion gap 3 (L) 5 - 15    Studies/Results: Ct Angio Chest Pe W/cm &/or Wo Cm  06/19/2014   CLINICAL DATA:  Shortness of breath, tachypnea. Crackles. Recent postpartum.  EXAM: CT ANGIOGRAPHY CHEST WITH CONTRAST  TECHNIQUE: Multidetector CT imaging of the chest was performed using the standard protocol during bolus administration of intravenous contrast. Multiplanar CT image reconstructions and MIPs were obtained to evaluate the vascular anatomy.  CONTRAST:  OMNIPAQUE IOHEXOL 350 MG/ML SOLN  COMPARISON:  None.  FINDINGS: There are no filling defects within the pulmonary arteries to suggest pulmonary embolus.  The thoracic aorta is normal in caliber. There is no evidence of dissection. Heart at the upper limits of normal in size. Small bilateral pleural effusions, right greater than left. No pericardial effusion. No mediastinal or hilar adenopathy.  There multi focal bilateral fluffy and ground-glass opacities, lower lobe and basilar predominant distribution. More confluent opacities are seen within the dependent bilateral lower lobes. There is smooth septal thickening. Motion artifact seen at the lung bases.  No acute abnormality in the included upper  abdomen. No osseous abnormalities.  Review of the MIP images confirms the above findings.  IMPRESSION: 1. No pulmonary embolus. 2. Small bilateral pleural effusions, right greater than left. Findings suggesting pulmonary edema. More confluent lower lobe opacities are likely confluent edema, less likely pneumonia or aspiration.   Electronically Signed   By: Rubye Oaks M.D.   On: 06/19/2014 05:38    Medications: Reviewed     @ 1. Acute systolic CHF  Volume is up some  On PO lasix  Remains tachycardic  Just got coreg  WIl need to follow HR May go up Keep on current regimen Would like to have HR improved before D/C  Patient questions again breast feeding  WIll review with pharmacy.  Formula for now.    LOS: 3 days   Dietrich Pates 06/20/2014, 9:37 AM

## 2014-06-21 LAB — BASIC METABOLIC PANEL
ANION GAP: 4 — AB (ref 5–15)
BUN: 14 mg/dL (ref 6–23)
CALCIUM: 8.2 mg/dL — AB (ref 8.4–10.5)
CHLORIDE: 107 mmol/L (ref 96–112)
CO2: 23 mmol/L (ref 19–32)
Creatinine, Ser: 0.7 mg/dL (ref 0.50–1.10)
GFR calc non Af Amer: >90 mL/min (ref >90)
Glucose, Bld: 87 mg/dL (ref 70–99)
POTASSIUM: 3.5 mmol/L (ref 3.5–5.1)
SODIUM: 134 mmol/L — AB (ref 135–145)

## 2014-06-21 MED ORDER — CARVEDILOL 12.5 MG PO TABS: 12.5000 mg | ORAL_TABLET | Freq: Two times a day (BID) | ORAL | Status: DC

## 2014-06-21 MED ORDER — IBUPROFEN 600 MG PO TABS: 600.0000 mg | ORAL_TABLET | Freq: Four times a day (QID) | ORAL | Status: DC

## 2014-06-21 MED ORDER — FUROSEMIDE 20 MG PO TABS: 20.0000 mg | ORAL_TABLET | Freq: Every day | ORAL | Status: DC

## 2014-06-21 MED ORDER — SPIRONOLACTONE 25 MG PO TABS: 12.5000 mg | ORAL_TABLET | Freq: Every day | ORAL | Status: DC

## 2014-06-21 MED ORDER — ALBUTEROL SULFATE (2.5 MG/3ML) 0.083% IN NEBU: 2.5000 mg | INHALATION_SOLUTION | Freq: Four times a day (QID) | RESPIRATORY_TRACT | Status: DC | PRN

## 2014-06-21 MED ORDER — CARVEDILOL 12.5 MG PO TABS
12.5000 mg | ORAL_TABLET | Freq: Two times a day (BID) | ORAL | Status: DC
  Administered 2014-06-21 (×2): 12.5 mg via ORAL
  Filled 2014-06-21 (×3): qty 1

## 2014-06-21 MED ORDER — OXYCODONE-ACETAMINOPHEN 5-325 MG PO TABS: 1.0000 | ORAL_TABLET | ORAL | Status: DC | PRN

## 2014-06-21 MED ORDER — LISINOPRIL 10 MG PO TABS: 10.0000 mg | ORAL_TABLET | Freq: Every day | ORAL | Status: DC

## 2014-06-21 NOTE — Progress Notes (Signed)
Late entry

## 2014-06-21 NOTE — Lactation Note (Signed)
This note was copied from the chart of Cassie Lawrence. Lactation Consultation Note  Patient Name: Cassie Lawrence [id number] Date: 06/21/2014   Visited with Mom in [place], baby 67 hrs old.  Mom has started putting baby to the breast this am.  Mom seated on side of bed, offered more support under baby.  Mom latching baby with good hand placement, needing a little guidance. Latch score-9, multiple swallowing.  Encouraged skin to skin, rather than keeping him wrapped in blanket.  Mom had fed baby 25 ml breast milk by bottle about 45 mins prior.  Talked about offering supplement of EBM after baby goes to breast (by Dad), and Mom to pump 15 minutes.  Concerned that baby has not stooled in 48 hrs.  Formula not being used now, but continuing to offer expressed breast milk by bottle after breast feeding.  Baby having adequate voids.  Copies of her medication information from [employer] given to [employer].  Encouraged feeding at least every 3 hrs. Mom has DEBP at home. To follow up tomorrow.  Recommended follow up after discharge with OP lactation appointment.  Mom would rather call from home, then make appointment now.   Consult Status Consult Status: Follow-up Date: 06/22/14 Follow-up type: In-patient    Cassie Lawrence, Cassie Lawrence 06/21/2014, 2:03 PM

## 2014-06-21 NOTE — Progress Notes (Signed)
Postpartum day #3, NSVD Postpartum cardiomyopathy  Subjective  Pt without complaints.  Feeling better.  Denies SOB.  Right sided pain under breast resolved. Desires inpatient circumcision. Pt will be discharged today.  FOB has to go to work tomorrow b/c he is in the first 90 days of a new job and financially needs to work.  He voiced concern about her staying alone with newborn and their young son.  He wants her to go to [place] ~ 1 hour away to stay with his family, which pt does not want to do.  We had a long discussion.  I do not recommend that pt leave the area for 1-2 weeks until it is determined that she is stable.  I agree that she does not need to be alone.  She may be able to get her family to stay with her.  I strongly encouraged he take at least 2 days off of work to help with the transition.  Work note will be provided.  Temp:  [98 F (36.7 C)-98.8 F (37.1 C)] 98.6 F (37 C) (02/28 0830) Pulse Rate:  [90-106] 90 (02/28 1251) Resp:  [16-18] 18 (02/28 1251) BP: (117-123)/(75-93) 118/84 mmHg (02/28 1251) SpO2:  [95 %-100 %] 98 % (02/28 1251) Weight:  [94.847 kg (209 lb 1.6 oz)-99.701 kg (219 lb 12.8 oz)] 94.847 kg (209 lb 1.6 oz) (02/28 0830)  UOP 2050/24 hours, 425 today  Gen:  NAD, A&O x 3 CV:  RRR Lungs:  CTA bilaterally, no crackles, good air movement Uterine fundus:  deferred Ext:  2-3+Edema, no calf tenderness bilaterally  CBC    Component Value Date/Time   WBC 11.1* 06/19/2014 1215   RBC 4.16 06/19/2014 1215   HGB 12.8 06/19/2014 1215   HCT 38.0 06/19/2014 1215   PLT 158 06/19/2014 1215   MCV 91.3 06/19/2014 1215   MCH 30.8 06/19/2014 1215   MCHC 33.7 06/19/2014 1215   RDW 13.7 06/19/2014 1215     A/P: S/p SVD  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  Cardiology following, changed dose of Carvedilol.  Pt will be discharged on Lasix. Tachycardia-resolved.   Lactation support.  Strongly encouraged pt continue this after  discharge due to diuretics effect on milk supply. Perform inpatient circumcision prior to discharge. Will call cardiology to coordinate follow up.  CNM Venus Standard updated on plan.  Geryl Rankins 06/21/2014, 3:37 PM

## 2014-06-21 NOTE — Progress Notes (Signed)
   Subjective: Breathing is OK  No CP   Objective: Filed Vitals:   06/21/14 0400 06/21/14 0600 06/21/14 0824 06/21/14 0830  BP: 122/81  119/75   Pulse: 100  101   Temp: 98.7 F (37.1 C)   98.6 F (37 C)  TempSrc: Oral   Oral  Resp: 16  18   Height:  5\' 5"  (1.651 m)    Weight:  219 lb 12.8 oz (99.701 kg)  209 lb 1.6 oz (94.847 kg)  SpO2: 95%  99%    Weight change: 11 lb 10.8 oz (5.296 kg)  Intake/Output Summary (Last 24 hours) at 06/21/14 0940 Last data filed at 06/21/14 0900  Gross per 24 hour  Intake   1420 ml  Output   2175 ml  Net   -755 ml   Net 3.8 L negative   General: Alert, awake, oriented x3, in no acute distress Neck:  JVP is normal Heart: Regular rate and rhythm, without murmurs, rubs, +S3 Lungs: Clear to auscultation.  No rales or wheezes. Exemities:  1+ edema.   Neuro: Grossly intact, nonfocal.   Lab Results: Results for orders placed or performed during the hospital encounter of 06/17/14 (from the past 24 hour(s))  Brain natriuretic peptide     Status: Abnormal   Collection Time: 06/20/14  1:00 PM  Result Value Ref Range   B Natriuretic Peptide 997.7 (H) 0.0 - 100.0 pg/mL  Basic metabolic panel     Status: Abnormal   Collection Time: 06/21/14  5:35 AM  Result Value Ref Range   Sodium 134 (L) 135 - 145 mmol/L   Potassium 3.5 3.5 - 5.1 mmol/L   Chloride 107 96 - 112 mmol/L   CO2 23 19 - 32 mmol/L   Glucose, Bld 87 70 - 99 mg/dL   BUN 14 6 - 23 mg/dL   Creatinine, Ser 0.07 0.50 - 1.10 mg/dL   Calcium 8.2 (L) 8.4 - 10.5 mg/dL   GFR calc non Af Amer >90 >90 mL/min   GFR calc Af Amer >90 >90 mL/min   Anion gap 4 (L) 5 - 15    Studies/Results: No results found.  Medications:Reviewed    @PROBHOSP @  1  Acute systolic CHF  Volume is still up some on exam  Breathing is OK I would increase Coreg to 12.5 bid and follow BP  Would like to see HR a little slower   Continue Aldactone  Continue lisinopril Given that she plans to breast feed would pull  back on lasix.  May need this but will follow closely in clinic  Will make sure she has appt this week   Would give Rx for lasix 20 to have on hand and may end up Rx as outpatient.    Note comments by consultants re breast feeding  It appears to be OK  Will need to follow response.    OK to d/c later today Follow coreg for a few hours    LOS: 4 days   Dietrich Pates 06/21/2014, 9:40 AM

## 2014-06-21 NOTE — Progress Notes (Signed)
Post discharge chart review completed.  

## 2014-06-22 ENCOUNTER — Inpatient Hospital Stay (HOSPITAL_COMMUNITY): Payer: 59

## 2014-06-22 ENCOUNTER — Inpatient Hospital Stay (HOSPITAL_COMMUNITY)
Admission: AD | Admit: 2014-06-22 | Discharge: 2014-06-25 | DRG: 774 | Disposition: A | Discharge status: Home / Self Care | Payer: 59 | Source: Ambulatory Visit | Attending: Internal Medicine | Admitting: Internal Medicine

## 2014-06-22 ENCOUNTER — Encounter (HOSPITAL_COMMUNITY): Payer: Self-pay | Admitting: *Deleted

## 2014-06-22 DIAGNOSIS — O9989 Other specified diseases and conditions complicating pregnancy, childbirth and the puerperium: Secondary | ICD-10-CM | POA: Diagnosis not present

## 2014-06-22 DIAGNOSIS — O1092 Unspecified pre-existing hypertension complicating childbirth: Secondary | ICD-10-CM | POA: Diagnosis present

## 2014-06-22 DIAGNOSIS — O169 Unspecified maternal hypertension, unspecified trimester: Secondary | ICD-10-CM

## 2014-06-22 DIAGNOSIS — I5021 Acute systolic (congestive) heart failure: Secondary | ICD-10-CM | POA: Diagnosis not present

## 2014-06-22 DIAGNOSIS — I5023 Acute on chronic systolic (congestive) heart failure: Secondary | ICD-10-CM | POA: Diagnosis present

## 2014-06-22 DIAGNOSIS — O903 Peripartum cardiomyopathy: Secondary | ICD-10-CM

## 2014-06-22 LAB — CBC WITH DIFFERENTIAL/PLATELET
Basophils Absolute: 0 10*3/uL (ref 0.0–0.1)
Basophils Relative: 0 % (ref 0–1)
EOS ABS: 0.2 10*3/uL (ref 0.0–0.7)
Eosinophils Relative: 2 % (ref 0–5)
HEMATOCRIT: 40.8 % (ref 36.0–46.0)
Hemoglobin: 13.5 g/dL (ref 12.0–15.0)
LYMPHS ABS: 1.7 10*3/uL (ref 0.7–4.0)
LYMPHS PCT: 13 % (ref 12–46)
MCH: 30.8 pg (ref 26.0–34.0)
MCHC: 33.1 g/dL (ref 30.0–36.0)
MCV: 92.9 fL (ref 78.0–100.0)
MONO ABS: 0.5 10*3/uL (ref 0.1–1.0)
Monocytes Relative: 4 % (ref 3–12)
Neutro Abs: 10.5 10*3/uL — ABNORMAL HIGH (ref 1.7–7.7)
Neutrophils Relative %: 81 % — ABNORMAL HIGH (ref 43–77)
Platelets: 202 10*3/uL (ref 150–400)
RBC: 4.39 MIL/uL (ref 3.87–5.11)
RDW: 14 % (ref 11.5–15.5)
WBC: 13 10*3/uL — AB (ref 4.0–10.5)

## 2014-06-22 LAB — COMPREHENSIVE METABOLIC PANEL
ALBUMIN: 3.3 g/dL — AB (ref 3.5–5.2)
ALT: 72 U/L — ABNORMAL HIGH (ref 0–35)
AST: 74 U/L — ABNORMAL HIGH (ref 0–37)
Alkaline Phosphatase: 160 U/L — ABNORMAL HIGH (ref 39–117)
Anion gap: 4 — ABNORMAL LOW (ref 5–15)
BUN: 12 mg/dL (ref 6–23)
CO2: 25 mmol/L (ref 19–32)
CREATININE: 0.76 mg/dL (ref 0.50–1.10)
Calcium: 8.5 mg/dL (ref 8.4–10.5)
Chloride: 109 mmol/L (ref 96–112)
GFR calc Af Amer: >90 mL/min (ref >90)
GFR calc non Af Amer: >90 mL/min (ref >90)
Glucose, Bld: 79 mg/dL (ref 70–99)
Potassium: 4.2 mmol/L (ref 3.5–5.1)
Sodium: 138 mmol/L (ref 135–145)
Total Bilirubin: 0.6 mg/dL (ref 0.3–1.2)
Total Protein: 7.2 g/dL (ref 6.0–8.3)

## 2014-06-22 MED ORDER — FUROSEMIDE 20 MG PO TABS
20.0000 mg | ORAL_TABLET | Freq: Every day | ORAL | Status: DC
  Administered 2014-06-22 – 2014-06-25 (×4): 20 mg via ORAL
  Filled 2014-06-22 (×6): qty 1

## 2014-06-22 MED ORDER — DIPHENHYDRAMINE HCL 25 MG PO CAPS: 25.0000 mg | ORAL_CAPSULE | Freq: Four times a day (QID) | ORAL | Status: DC | PRN

## 2014-06-22 MED ORDER — ALBUTEROL SULFATE (2.5 MG/3ML) 0.083% IN NEBU: 2.5000 mg | INHALATION_SOLUTION | Freq: Four times a day (QID) | RESPIRATORY_TRACT | Status: DC | PRN

## 2014-06-22 MED ORDER — DIBUCAINE 1 % RE OINT: 1.0000 "application " | TOPICAL_OINTMENT | RECTAL | Status: DC | PRN

## 2014-06-22 MED ORDER — FUROSEMIDE 20 MG PO TABS
20.0000 mg | ORAL_TABLET | Freq: Once | ORAL | Status: AC
  Administered 2014-06-22: 20 mg via ORAL
  Filled 2014-06-22: qty 1

## 2014-06-22 MED ORDER — PRENATAL MULTIVITAMIN CH
1.0000 | ORAL_TABLET | Freq: Every day | ORAL | Status: DC
  Administered 2014-06-22 – 2014-06-25 (×4): 1 via ORAL
  Filled 2014-06-22 (×5): qty 1

## 2014-06-22 MED ORDER — IBUPROFEN 600 MG PO TABS
600.0000 mg | ORAL_TABLET | Freq: Four times a day (QID) | ORAL | Status: DC
  Administered 2014-06-22 – 2014-06-23 (×5): 600 mg via ORAL
  Filled 2014-06-22 (×8): qty 1

## 2014-06-22 MED ORDER — LISINOPRIL 10 MG PO TABS
10.0000 mg | ORAL_TABLET | Freq: Every day | ORAL | Status: DC
  Administered 2014-06-22 – 2014-06-25 (×4): 10 mg via ORAL
  Filled 2014-06-22 (×5): qty 1

## 2014-06-22 MED ORDER — BENZOCAINE-MENTHOL 20-0.5 % EX AERO: 1.0000 "application " | INHALATION_SPRAY | CUTANEOUS | Status: DC | PRN

## 2014-06-22 MED ORDER — OXYCODONE-ACETAMINOPHEN 5-325 MG PO TABS: 1.0000 | ORAL_TABLET | ORAL | Status: DC | PRN

## 2014-06-22 MED ORDER — CARVEDILOL 12.5 MG PO TABS
12.5000 mg | ORAL_TABLET | Freq: Two times a day (BID) | ORAL | Status: DC
  Administered 2014-06-22 – 2014-06-23 (×3): 12.5 mg via ORAL
  Filled 2014-06-22 (×5): qty 1

## 2014-06-22 MED ORDER — LANOLIN HYDROUS EX OINT: TOPICAL_OINTMENT | CUTANEOUS | Status: DC | PRN

## 2014-06-22 MED ORDER — WITCH HAZEL-GLYCERIN EX PADS: 1.0000 "application " | MEDICATED_PAD | CUTANEOUS | Status: DC | PRN

## 2014-06-22 MED ORDER — SPIRONOLACTONE 12.5 MG HALF TABLET
12.5000 mg | ORAL_TABLET | Freq: Every day | ORAL | Status: DC
  Administered 2014-06-22 – 2014-06-24 (×3): 12.5 mg via ORAL
  Filled 2014-06-22 (×5): qty 1

## 2014-06-22 NOTE — Progress Notes (Signed)
       Patient Name: Lisha Daves Date of Encounter: 06/22/2014   SUBJECTIVE:We were re-alerted that the patient never left the hospital. She went to MAU prior to going home and developed severe dyspnea and chest tightness. Discharge was cabnceled. Overall, feels much better today. Has not been by her OB yet today. She and family appear comfortable and husband holding the baby.  TELEMETRY:  NSR Filed Vitals:   06/22/14 1022 06/22/14 1029 06/22/14 1100 06/22/14 1400  BP:   114/88 123/82  Pulse: 97 110 106 98  Temp:   99 F (37.2 C) 98 F (36.7 C)  TempSrc:   Oral Oral  Resp:   18 18  Height:      Weight:      SpO2: 96% 98% 100% 96%    Intake/Output Summary (Last 24 hours) at 06/22/14 1636 Last data filed at 06/22/14 1610  Gross per 24 hour  Intake    840 ml  Output    550 ml  Net    290 ml   LABS: Basic Metabolic Panel:  Recent Labs  71/16/57 0535 06/22/14 1048  NA 134* 138  K 3.5 4.2  CL 107 109  CO2 23 25  GLUCOSE 87 79  BUN 14 12  CREATININE 0.70 0.76  CALCIUM 8.2* 8.5   CBC:  Recent Labs  06/22/14 1048  WBC 13.0*  NEUTROABS 10.5*  HGB 13.5  HCT 40.8  MCV 92.9  PLT 202     Radiology/Studies:  No new data  Physical Exam: Blood pressure 123/82, pulse 98, temperature 98 F (36.7 C), temperature source Oral, resp. rate 18, height 5\' 5"  (1.651 m), weight 209 lb 4 oz (94.915 kg), SpO2 96 %, currently breastfeeding. Weight change:   Wt Readings from Last 3 Encounters:  06/22/14 209 lb 4 oz (94.915 kg)  06/21/14 209 lb 1.6 oz (94.847 kg)  12/27/11 167 lb (75.751 kg)   Unable to appreciate neck veins. S3 gallop. Decreased basilar breath sounds. Trace peripheral edema,.  ASSESSMENT:  1. Acute systolic heart failure, still with volume overload. 2. Peripartum cardiomyopathy 3. Peripartum hypertension  Plan:  1. Extra 20 mg of oral lasix tonight 2. BMET in AM 3. Heart failure team to f/u tomorrow.  KAYALA, TEJADA 06/22/2014,  4:36 PM

## 2014-06-22 NOTE — MAU Note (Signed)
Pt staying on MBU with her newborn and newly diagnosed with CHF, states her feels like she can;t take a deep breath and her chest feels tight.

## 2014-06-22 NOTE — Progress Notes (Signed)
O2 reapplied per request of V. Standard, CNM. 3L/min via nasal canula.

## 2014-06-22 NOTE — MAU Provider Note (Signed)
History  Cassie Lawrence is a 29 yo [id number], pp day #4, NSVD w/ postpartum cardiomyopathy, presents to MAU after visiting her baby in nursery, accompanied by staff, w/ c/o chest tightness and inability to take in a deep breath. States began feeling restless, hot and anxious around 02:30 AM.    Pt d/c'd last evening at 7 pm. Has f/u this week w/ Cardiology.    Patient Active Problem List   Diagnosis Date Noted  . Postpartum cardiomyopathy 06/22/2014  . IUGR (intrauterine growth restriction) 06/17/2014  . Vaginal delivery 10/04/2011  . Second degree perineal laceration during delivery 10/04/2011  . Pregnant state, incidental 08/23/2011  . Penicillin allergy 08/08/2011    No chief complaint on file.  HPI As above OB History    Gravida Para Term Preterm AB TAB SAB Ectopic Multiple Living   0 2      Past Medical History  Diagnosis Date  . H/O chlamydia infection   . History of gonorrhea   . Trichomonas infection   . H/O candidiasis   . H/O varicella   . No pertinent past medical history     Past Surgical History  Procedure Laterality Date  . Induced abortion    . No past surgeries      Family History  Problem Relation Age of Onset  . Hypertension Mother   . Heart disease Maternal Grandmother   . Asthma Maternal Grandmother   . COPD Maternal Grandmother   . Diabetes Maternal Grandmother   . Diabetes Maternal Grandfather     History  Substance Use Topics  . Smoking status: Never Smoker   . Smokeless tobacco: Never Used  . Alcohol Use: No    Allergies:  Allergies  Allergen Reactions  . Penicillins Swelling    Prescriptions prior to admission  Medication Sig Dispense Refill Last Dose  . albuterol (PROVENTIL) (2.5 MG/3ML) 0.083% nebulizer solution Take 3 mLs (2.5 mg total) by nebulization every 6 (six) hours as needed for wheezing or shortness of breath. 75 mL 12   . carvedilol (COREG) 12.5 MG tablet Take 1 tablet (12.5 mg total) by mouth 2  (two) times daily with a meal. 60 tablet 0   . furosemide (LASIX) 20 MG tablet Take 1 tablet (20 mg total) by mouth daily. 30 tablet 0   . ibuprofen (ADVIL,MOTRIN) 600 MG tablet Take 1 tablet (600 mg total) by mouth every 6 (six) hours. 30 tablet 0   . lisinopril (PRINIVIL,ZESTRIL) 10 MG tablet Take 1 tablet (10 mg total) by mouth daily. 30 tablet 0   . oxyCODONE-acetaminophen (PERCOCET/ROXICET) 5-325 MG per tablet Take 1 tablet by mouth every 4 (four) hours as needed (for pain scale less than 7). 30 tablet 0   . Prenatal Vit-Fe Fumarate-FA (PRENATAL MULTIVITAMIN) TABS Take 1 tablet by mouth daily.   06/16/2014 at Unknown time  . spironolactone (ALDACTONE) 25 MG tablet Take 0.5 tablets (12.5 mg total) by mouth daily. 30 tablet 0     ROS  SOB Chest tightness Physical Exam   Blood pressure 128/99, pulse 99, temperature 98.3 F (36.8 C), temperature source Oral, resp. rate 18, height  (1.651 m), weight 209 lb 4 oz (94.915 kg), SpO2 93 %, currently breastfeeding.  Today's Vitals   06/22/14 0651 06/22/14 0653 06/22/14 0729 06/22/14 0730  BP:    128/99  Pulse:  99  99  Temp:    98.3 F (36.8 C)  TempSrc:    Oral  Resp:    18  Height:      Weight:      SpO2: 100% 100% 93%   Initial BP  131/102, pulse 95-105, Resp 18-22, SP02 93% on RA  Physical Exam Gen: NAD Lungs: Diminished, no crackles or rhonchi CV: RRR ED Course  CXR 02 per face mask  Assessment: SOB  Plan: Discussed w/ Dr. Dion Body. CXR ordered. Report given to oncoming CNM for continuation of care.   Sherre Scarlet CNM, MS 06/22/2014 07:00 AM

## 2014-06-22 NOTE — MAU Provider Note (Signed)
MAU Addendum Note [place] reports: Small effusions have almost completely resolved.  The bilateral pulmonary edema persists at the lung bases.  Heart size and pulmonary vascularity are normal.  Slight thoracic scoliosis is unchanged.    Plan: Admit for observation Lasix PO CBC, CMP Consulted with Dr. Maureen Ralphs Cassie Lawrence, CNM, MSN 06/22/2014. 8:59 AM

## 2014-06-22 NOTE — Progress Notes (Signed)
Patient states she feels a little bit better. States chest does not feel as tight. States she does not feel that she needs O2 at this time.

## 2014-06-22 NOTE — Consult Note (Signed)
Mom was readmitted to women's unit, after going to MAU with shortness of breathe. She had not been able to get her lasix prescription filled, and was feeling much better today. She has been pumping and breast feeding, and supplemntning the baby with EBM. The baby did get some formula earlier today, when he was in the CNS. The baby is gaining weight well. Mom is pumping about 30 mls after breastfeeidig. I told her to not hesitate adding formula as needed. Mom knows to call for questions/concerns.

## 2014-06-23 ENCOUNTER — Encounter: Payer: Self-pay | Admitting: Obstetrics and Gynecology

## 2014-06-23 DIAGNOSIS — O9989 Other specified diseases and conditions complicating pregnancy, childbirth and the puerperium: Secondary | ICD-10-CM | POA: Diagnosis present

## 2014-06-23 DIAGNOSIS — O1092 Unspecified pre-existing hypertension complicating childbirth: Secondary | ICD-10-CM | POA: Diagnosis present

## 2014-06-23 DIAGNOSIS — I5021 Acute systolic (congestive) heart failure: Secondary | ICD-10-CM | POA: Diagnosis present

## 2014-06-23 DIAGNOSIS — O903 Peripartum cardiomyopathy: Secondary | ICD-10-CM | POA: Diagnosis present

## 2014-06-23 LAB — CBC
HCT: 42.4 % (ref 36.0–46.0)
HEMOGLOBIN: 14.2 g/dL (ref 12.0–15.0)
MCH: 30.8 pg (ref 26.0–34.0)
MCHC: 33.5 g/dL (ref 30.0–36.0)
MCV: 92 fL (ref 78.0–100.0)
Platelets: 232 10*3/uL (ref 150–400)
RBC: 4.61 MIL/uL (ref 3.87–5.11)
RDW: 14.1 % (ref 11.5–15.5)
WBC: 13.6 10*3/uL — AB (ref 4.0–10.5)

## 2014-06-23 LAB — COMPREHENSIVE METABOLIC PANEL
ALBUMIN: 3.1 g/dL — AB (ref 3.5–5.2)
ALK PHOS: 151 U/L — AB (ref 39–117)
ALT: 79 U/L — ABNORMAL HIGH (ref 0–35)
AST: 80 U/L — AB (ref 0–37)
Anion gap: 10 (ref 5–15)
BILIRUBIN TOTAL: 0.6 mg/dL (ref 0.3–1.2)
BUN: 13 mg/dL (ref 6–23)
CHLORIDE: 107 mmol/L (ref 96–112)
CO2: 22 mmol/L (ref 19–32)
Calcium: 9 mg/dL (ref 8.4–10.5)
Creatinine, Ser: 0.77 mg/dL (ref 0.50–1.10)
GFR calc Af Amer: >90 mL/min (ref >90)
GFR calc non Af Amer: >90 mL/min (ref >90)
Glucose, Bld: 87 mg/dL (ref 70–99)
POTASSIUM: 4.2 mmol/L (ref 3.5–5.1)
Sodium: 139 mmol/L (ref 135–145)
Total Protein: 7 g/dL (ref 6.0–8.3)

## 2014-06-23 LAB — BASIC METABOLIC PANEL
ANION GAP: 2 — AB (ref 5–15)
BUN: 16 mg/dL (ref 6–23)
CHLORIDE: 111 mmol/L (ref 96–112)
CO2: 22 mmol/L (ref 19–32)
Calcium: 8.4 mg/dL (ref 8.4–10.5)
Creatinine, Ser: 0.67 mg/dL (ref 0.50–1.10)
GFR calc non Af Amer: >90 mL/min (ref >90)
Glucose, Bld: 96 mg/dL (ref 70–99)
Potassium: 4 mmol/L (ref 3.5–5.1)
SODIUM: 135 mmol/L (ref 135–145)

## 2014-06-23 MED ORDER — FUROSEMIDE 20 MG PO TABS
20.0000 mg | ORAL_TABLET | Freq: Once | ORAL | Status: AC
  Administered 2014-06-23: 20 mg via ORAL
  Filled 2014-06-23: qty 1

## 2014-06-23 MED ORDER — FUROSEMIDE 10 MG/ML IJ SOLN: 40.0000 mg | Freq: Every day | INTRAMUSCULAR | Status: DC

## 2014-06-23 MED ORDER — CARVEDILOL 25 MG PO TABS: 25.0000 mg | ORAL_TABLET | Freq: Two times a day (BID) | ORAL | Status: DC

## 2014-06-23 MED ORDER — CARVEDILOL 6.25 MG PO TABS
6.2500 mg | ORAL_TABLET | Freq: Two times a day (BID) | ORAL | Status: DC
  Administered 2014-06-24 – 2014-06-25 (×3): 6.25 mg via ORAL
  Filled 2014-06-23 (×6): qty 1

## 2014-06-23 NOTE — Progress Notes (Addendum)
Post Partum Day 5 s/p VAVD Subjective: no complaints, up ad lib, voiding, tolerating PO and + flatus.  Pt reports no difficulty breathing right now but she gets symptomatic every night and is concerned about going home and it happening again.  Objective: Blood pressure 108/85, pulse 91, temperature 98.8 F (37.1 C), temperature source Oral, resp. rate 20, height 5\' 5"  (1.651 m), weight 94.915 kg (209 lb 4 oz), SpO2 97 %, currently breastfeeding. Room Air  Physical Exam:  General: alert and no distress  Lungs crackles at bilateral bases otherwise clear Lochia: appropriate Uterine Fundus: firm, NT DVT Evaluation: No evidence of DVT seen on physical exam, no edema   Recent Labs  06/22/14 1048 06/23/14 0520  HGB 13.5 14.2  HCT 40.8 42.4    Assessment/Plan: Plan per cardiology My question however is whether MgSO4 is a contraindication given that pulmonary edema is a possible SE of Mg.  Pt has preeclampsia as well and may possibly benefit from a 24hr course.  LFTs are slightly increased from yesterday.      Purcell Nails 06/23/2014, 4:38 PM   Dr. Mayford Knife just spoke with me after she spoke with the heart failure specialist and they recommended decreasing the coreg to 6.25mg  BID and sustain a mild tachycardia and treat with increased dosing of lasix.  Will give another 20mg  po now and increase to 40mg  daily per Dr.Turner's rec.  She also did not agree with continue breastfeeding while on lisinopril and I agree and told the patient as much.  She recommended transferring patient to telemetry unit 3 East at [facility] so she could be followed by the heart failure specialist because they are not able to get over to [facility] secondary to their busy service there.  I am transferring pt to Dr. Elly Modena.

## 2014-06-23 NOTE — Progress Notes (Addendum)
Pt states that at around 0200 this am she started experiencing SOB.  She was put on 3L oxygen.  I assumed care of this pt this morning at 0700.  Upon my am assessment at 0915 pt stated that she is feeling comfortable on the 3L of oxygen, she is able to recline in the bed.  She stated that when she talks a lot or moves around a lot she can feel the difference and gets a little SOB even on the oxygen.  While on the oxygen pt has been 97-98%.  Pt states that when she walks to and from the bathroom she feels okay.  At 1050 the oxygen was taken off to see how she feels with out it for a while.  Instructed pt to let me know when she feels like she needs to put in back on if she is getting uncomfortable.  Rayaan Garguilo RN  Pt states that she does well during the day with out the oxygen , but it seems to get worse during the night.  Last night she could not lay down.  She ended up having to sit on the side of the bed leaning forward slightly to feel like she could take a deep breath.  Leda Roys RN

## 2014-06-23 NOTE — Progress Notes (Signed)
Patient left with [facility] at this time, report and paperwork given with [facility] RN from [place]

## 2014-06-23 NOTE — Progress Notes (Signed)
Report called to [name] RN at [facility] 3 East/3700. Patient will be taken via [facility] to resume care at 3700 in Room 310.

## 2014-06-23 NOTE — Progress Notes (Addendum)
SUBJECTIVE:  Up moving in room  OBJECTIVE:   Vitals:   Filed Vitals:   06/23/14 1000 06/23/14 1100 06/23/14 1400 06/23/14 1716  BP: 116/80  108/85 125/89  Pulse: 98  91 103  Temp: 99.2 F (37.3 C)  98.8 F (37.1 C) 98.2 F (36.8 C)  TempSrc: Oral  Oral Oral  Resp: 18  20 20   Height:      Weight:      SpO2: 97% 96% 97% 94%   I&O's:   Intake/Output Summary (Last 24 hours) at 06/23/14 1744 Last data filed at 06/23/14 1500  Gross per 24 hour  Intake    930 ml  Output   2150 ml  Net  -1220 ml       PHYSICAL EXAM General: Well developed, well nourished, in no acute distress Head: Eyes PERRLA, No xanthomas.   Normal cephalic and atramatic  Lungs:   Clear bilaterally to auscultation and percussion. Heart:   HRRR tachy S1 S2 Pulses are 2+ & equal. Abdomen: Bowel sounds are positive, abdomen soft and non-tender without masses  Extremities:   No clubbing, cyanosis or edema.  DP +1 Neuro: Alert and oriented X 3. Psych:  Good affect, responds appropriately   LABS: Basic Metabolic Panel:  Recent Labs  14/43/15 1048 06/23/14 0520  NA 138 139  135  K 4.2 4.2  4.0  CL 109 107  111  CO2 25 22  22   GLUCOSE 79 87  96  BUN 12 13  16   CREATININE 0.76 0.77  0.67  CALCIUM 8.5 9.0  8.4   Liver Function Tests:  Recent Labs  06/22/14 1048 06/23/14 0520  AST 74* 80*  ALT 72* 79*  ALKPHOS 160* 151*  BILITOT 0.6 0.6  PROT 7.2 7.0  ALBUMIN 3.3* 3.1*   No results for input(s): LIPASE, AMYLASE in the last 72 hours. CBC:  Recent Labs  06/22/14 1048 06/23/14 0520  WBC 13.0* 13.6*  NEUTROABS 10.5*  --   HGB 13.5 14.2  HCT 40.8 42.4  MCV 92.9 92.0  PLT 202 232   Cardiac Enzymes: No results for input(s): CKTOTAL, CKMB, CKMBINDEX, TROPONINI in the last 72 hours. BNP: Invalid input(s): POCBNP D-Dimer: No results for input(s): DDIMER in the last 72 hours. Hemoglobin A1C: No results for input(s): HGBA1C in the last 72 hours. Fasting Lipid Panel: No  results for input(s): CHOL, HDL, LDLCALC, TRIG, CHOLHDL, LDLDIRECT in the last 72 hours. Thyroid Function Tests: No results for input(s): TSH, T4TOTAL, T3FREE, THYROIDAB in the last 72 hours.  Invalid input(s): FREET3 Anemia Panel: No results for input(s): VITAMINB12, FOLATE, FERRITIN, TIBC, IRON, RETICCTPCT in the last 72 hours. Coag Panel:   No results found for: INR, PROTIME  RADIOLOGY: Dg Chest 2 View  06/22/2014   CLINICAL DATA:  Pulmonary edema.  Pleural effusions.  EXAM: CHEST  2 VIEW  COMPARISON:  Chest CT dated 06/19/2014  FINDINGS: The small effusions have almost completely resolved. The bilateral pulmonary edema persists at the lung bases.  Heart size and pulmonary vascularity are normal. Slight thoracic scoliosis is unchanged.  IMPRESSION: 1. Persistent bibasilar pulmonary edema. 2. Almost complete resolution of the small bilateral effusions.   Electronically Signed   By: Francene Boyers M.D.   On: 06/22/2014 07:30   Ct Angio Chest Pe W/cm &/or Wo Cm  06/19/2014   CLINICAL DATA:  Shortness of breath, tachypnea. Crackles. Recent postpartum.  EXAM: CT ANGIOGRAPHY CHEST WITH CONTRAST  TECHNIQUE: Multidetector CT imaging of the chest  was performed using the standard protocol during bolus administration of intravenous contrast. Multiplanar CT image reconstructions and MIPs were obtained to evaluate the vascular anatomy.  CONTRAST:  OMNIPAQUE IOHEXOL 350 MG/ML SOLN  COMPARISON:  None.  FINDINGS: There are no filling defects within the pulmonary arteries to suggest pulmonary embolus.  The thoracic aorta is normal in caliber. There is no evidence of dissection. Heart at the upper limits of normal in size. Small bilateral pleural effusions, right greater than left. No pericardial effusion. No mediastinal or hilar adenopathy.  There multi focal bilateral fluffy and ground-glass opacities, lower lobe and basilar predominant distribution. More confluent opacities are seen within the dependent  bilateral lower lobes. There is smooth septal thickening. Motion artifact seen at the lung bases.  No acute abnormality in the included upper abdomen. No osseous abnormalities.  Review of the MIP images confirms the above findings.  IMPRESSION: 1. No pulmonary embolus. 2. Small bilateral pleural effusions, right greater than left. Findings suggesting pulmonary edema. More confluent lower lobe opacities are likely confluent edema, less likely pneumonia or aspiration.   Electronically Signed   By: Rubye Oaks M.D.   On: 06/19/2014 05:38    ASSESSMENT/PLAN:  1. Acute systolic heart failure - on exam lungs are clear and no LE edema but got SOB last PM but not during today.  She is net 900cc negative.  Renal function stable.  Remains slightly tachycardic.  BP stable.  Discussed with Dr. Gala Romney who recommended not to titrate BB in setting of acute CHF so will take back down to 6.25mg  BID which is what he placed her on Friday 2/26.  Will titrate ACE I as BP tolerates. Continue PO Lasix.   Continue ACE I and aldactone.  The patient was told by Dr. Gala Romney to stop breast feeding due to ACE I therapy but she has continued to breast feed.  The patient states she was told it was ok.  I have spoken to her OB Dr. 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     Quintella Reichert, MD  06/23/2014  5:44 PM

## 2014-06-23 NOTE — H&P (Signed)
RFA: Transferred to HF from [facility] HPI:  29yoF with hx of peripartum cardiomyopathy transferred from [facility] hospital for further management of CHF.  Per H and P on 06/17/14 she was admitted on 06/19/14 for elevated BP at 39 weeks pregancy.  She is now Postpartum day 5.  Has mild peripheral edema, normal JVP, but notes orthopnea, PND, and palpitations.  Otherwise denes F/C/dysuria/  Review of Systems:  12 point ROS is notable for above   Past Medical History  Diagnosis Date  . H/O chlamydia infection   . History of gonorrhea   . Trichomonas infection   . H/O candidiasis   . H/O varicella   . No pertinent past medical history     Scheduled Meds: . carvedilol  6.25 mg Oral BID WC  . furosemide  20 mg Oral Daily  . ibuprofen  600 mg Oral 4 times per day  . lisinopril  10 mg Oral Daily  . prenatal multivitamin  1 tablet Oral Daily  . spironolactone  12.5 mg Oral Daily   Continuous Infusions:  PRN Meds:.albuterol, benzocaine-Menthol, witch hazel-glycerin **AND** dibucaine, diphenhydrAMINE, lanolin, oxyCODONE-acetaminophen    Allergies  Allergen Reactions  . Penicillins Swelling    History   Social History  . Marital Status: Single    Spouse Name: N/A  . Number of Children: N/A  . Years of Education: N/A   Occupational History  . Not on file.   Social History Main Topics  . Smoking status: Never Smoker   . Smokeless tobacco: Never Used  . Alcohol Use: No  . Drug Use: No  . Sexual Activity: Not on file   Other Topics Concern  . Not on file   Social History Narrative    Family History  Problem Relation Age of Onset  . Hypertension Mother   . Heart disease Maternal Grandmother   . Asthma Maternal Grandmother   . COPD Maternal Grandmother   . Diabetes Maternal Grandmother   . Diabetes Maternal Grandfather     PHYSICAL EXAM: Filed Vitals:   06/23/14 2127  BP: 114/95  Pulse: 101  Temp: 98.4 F (36.9 C)  Resp: 18   General:  Well  appearing. No respiratory difficulty HEENT: normal Neck: supple. no JVD. Carotids 2+ bilat; no bruits. No lymphadenopathy or thryomegaly appreciated. Cor: PMI nondisplaced. Regular rate & rhythm. No rubs, gallops or murmurs. Lungs: clear Abdomen: soft, nontender, nondistended. No hepatosplenomegaly. No bruits or masses. Good bowel sounds. Extremities: no cyanosis, clubbing, rash.  + mild edema Neuro: alert & oriented x 3, cranial nerves grossly intact. moves all 4 extremities w/o difficulty. Affect pleasant.  ECG: NSR 100, LAE, no ST changes  Results for orders placed or performed during the hospital encounter of 06/22/14 (from the past 24 hour(s))  CBC     Status: Abnormal   Collection Time: 06/23/14  5:20 AM  Result Value Ref Range   WBC 13.6 (H) 4.0 - 10.5 K/uL   RBC 4.61 3.87 - 5.11 MIL/uL   Hemoglobin 14.2 12.0 - 15.0 g/dL   HCT 16.5 79.0 - 38.3 %   MCV 92.0 78.0 - 100.0 fL   MCH 30.8 26.0 - 34.0 pg   MCHC 33.5 30.0 - 36.0 g/dL   RDW 33.8 32.9 - 19.1 %   Platelets 232 150 - 400 K/uL  Basic metabolic panel     Status: Abnormal   Collection Time: 06/23/14  5:20 AM  Result Value Ref Range   Sodium 135 135 - 145  mmol/L   Potassium 4.0 3.5 - 5.1 mmol/L   Chloride 111 96 - 112 mmol/L   CO2 22 19 - 32 mmol/L   Glucose, Bld 96 70 - 99 mg/dL   BUN 16 6 - 23 mg/dL   Creatinine, Ser 1.61 0.50 - 1.10 mg/dL   Calcium 8.4 8.4 - 09.6 mg/dL   GFR calc non Af Amer >90 >90 mL/min   GFR calc Af Amer >90 >90 mL/min   Anion gap 2 (L) 5 - 15  Comprehensive metabolic panel     Status: Abnormal   Collection Time: 06/23/14  5:20 AM  Result Value Ref Range   Sodium 139 135 - 145 mmol/L   Potassium 4.2 3.5 - 5.1 mmol/L   Chloride 107 96 - 112 mmol/L   CO2 22 19 - 32 mmol/L   Glucose, Bld 87 70 - 99 mg/dL   BUN 13 6 - 23 mg/dL   Creatinine, Ser 0.45 0.50 - 1.10 mg/dL   Calcium 9.0 8.4 - 40.9 mg/dL   Total Protein 7.0 6.0 - 8.3 g/dL   Albumin 3.1 (L) 3.5 - 5.2 g/dL   AST 80 (H) 0 - 37 U/L     ALT 79 (H) 0 - 35 U/L   Alkaline Phosphatase 151 (H) 39 - 117 U/L   Total Bilirubin 0.6 0.3 - 1.2 mg/dL   GFR calc non Af Amer >90 >90 mL/min   GFR calc Af Amer >90 >90 mL/min   Anion gap 10 5 - 15   Dg Chest 2 View  06/22/2014   CLINICAL DATA:  Pulmonary edema.  Pleural effusions.  EXAM: CHEST  2 VIEW  COMPARISON:  Chest CT dated 06/19/2014  FINDINGS: The small effusions have almost completely resolved. The bilateral pulmonary edema persists at the lung bases.  Heart size and pulmonary vascularity are normal. Slight thoracic scoliosis is unchanged.  IMPRESSION: 1. Persistent bibasilar pulmonary edema. 2. Almost complete resolution of the small bilateral effusions.   Electronically Signed   By: Francene Boyers M.D.   On: 06/22/2014 07:30     ASSESSMENT: 29yoF with peripartum cadiomyopathy and acute systolic heart failure.  Post partum day 5.    1. Acute systolic heart failure -C/w coreg. Continue PO Lasix. Continue ACE I and aldactone. The patient was told by Dr. Gala Romney to stop breast feeding due to ACE I therapy but she has continued to breast feed. The patient states she was told it was ok. Her nurse is going to contact lactation specialist and pharmacy to see if this is contraindicated.  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  4.  Stop NSAIDs  Dossie Arbour, MD

## 2014-06-24 ENCOUNTER — Telehealth: Payer: Self-pay | Admitting: Obstetrics and Gynecology

## 2014-06-24 DIAGNOSIS — I5023 Acute on chronic systolic (congestive) heart failure: Secondary | ICD-10-CM

## 2014-06-24 LAB — BASIC METABOLIC PANEL
ANION GAP: 8 (ref 5–15)
BUN: 11 mg/dL (ref 6–23)
CALCIUM: 9.5 mg/dL (ref 8.4–10.5)
CHLORIDE: 105 mmol/L (ref 96–112)
CO2: 29 mmol/L (ref 19–32)
Creatinine, Ser: 0.81 mg/dL (ref 0.50–1.10)
GFR calc non Af Amer: >90 mL/min (ref >90)
Glucose, Bld: 91 mg/dL (ref 70–99)
Potassium: 3.9 mmol/L (ref 3.5–5.1)
Sodium: 142 mmol/L (ref 135–145)

## 2014-06-24 LAB — BRAIN NATRIURETIC PEPTIDE: B Natriuretic Peptide: 1130.9 pg/mL — ABNORMAL HIGH (ref 0.0–100.0)

## 2014-06-24 MED ORDER — ENOXAPARIN SODIUM 40 MG/0.4ML ~~LOC~~ SOLN
40.0000 mg | SUBCUTANEOUS | Status: DC
  Administered 2014-06-24: 40 mg via SUBCUTANEOUS
  Filled 2014-06-24 (×2): qty 0.4

## 2014-06-24 MED ORDER — SPIRONOLACTONE 12.5 MG HALF TABLET
12.5000 mg | ORAL_TABLET | Freq: Once | ORAL | Status: AC
  Administered 2014-06-24: 12.5 mg via ORAL
  Filled 2014-06-24: qty 1

## 2014-06-24 MED ORDER — SPIRONOLACTONE 25 MG PO TABS
25.0000 mg | ORAL_TABLET | Freq: Every day | ORAL | Status: DC
  Administered 2014-06-25: 25 mg via ORAL
  Filled 2014-06-24: qty 1

## 2014-06-24 MED ORDER — FUROSEMIDE 10 MG/ML IJ SOLN
40.0000 mg | Freq: Once | INTRAMUSCULAR | Status: AC
  Administered 2014-06-24: 40 mg via INTRAVENOUS
  Filled 2014-06-24: qty 4

## 2014-06-24 NOTE — Progress Notes (Signed)
Patient ID: Cassie Lawrence, female   DOB: 05-23-1985, 29 y.o.   MRN: [id number]   SUBJECTIVE: Patient had some dyspnea overnight, doing ok today.  Not doing much walking.   Scheduled Meds: . carvedilol  6.25 mg Oral BID WC  . furosemide  40 mg Intravenous Once  . furosemide  20 mg Oral Daily  . lisinopril  10 mg Oral Daily  . prenatal multivitamin  1 tablet Oral Daily  . spironolactone  12.5 mg Oral Once  . [START ON 06/25/2014] spironolactone  25 mg Oral Daily   Continuous Infusions:  PRN Meds:.albuterol, benzocaine-Menthol, witch hazel-glycerin **AND** dibucaine, diphenhydrAMINE, lanolin, oxyCODONE-acetaminophen  Filed Vitals:   06/23/14 2127 06/24/14 0201 06/24/14 0516 06/24/14 0741  BP: 114/95 101/65 98/72 125/66  Pulse: 101 96 91 101  Temp: 98.4 F (36.9 C) 97.7 F (36.5 C) 98.4 F (36.9 C)   TempSrc: Oral Oral Oral   Resp: 18 18 18    Height: 5' 5.5" (1.664 m)     Weight: 204 lb 6.4 oz (92.715 kg)  203 lb 4.8 oz (92.216 kg)   SpO2: 97% 96% 95%     Intake/Output Summary (Last 24 hours) at 06/24/14 1122 Last data filed at 06/24/14 1030  Gross per 24 hour  Intake    460 ml  Output   1250 ml  Net   -790 ml    LABS: Basic Metabolic Panel:  Recent Labs  00/86/76 1048 06/23/14 0520  NA 138 139  135  K 4.2 4.2  4.0  CL 109 107  111  CO2 25 22  22   GLUCOSE 79 87  96  BUN 12 13  16   CREATININE 0.76 0.77  0.67  CALCIUM 8.5 9.0  8.4   Liver Function Tests:  Recent Labs  06/22/14 1048 06/23/14 0520  AST 74* 80*  ALT 72* 79*  ALKPHOS 160* 151*  BILITOT 0.6 0.6  PROT 7.2 7.0  ALBUMIN 3.3* 3.1*   No results for input(s): LIPASE, AMYLASE in the last 72 hours. CBC:  Recent Labs  06/22/14 1048 06/23/14 0520  WBC 13.0* 13.6*  NEUTROABS 10.5*  --   HGB 13.5 14.2  HCT 40.8 42.4  MCV 92.9 92.0  PLT 202 232   Cardiac Enzymes: No results for input(s): CKTOTAL, CKMB, CKMBINDEX, TROPONINI in the last 72 hours. BNP: Invalid input(s):  POCBNP D-Dimer: No results for input(s): DDIMER in the last 72 hours. Hemoglobin A1C: No results for input(s): HGBA1C in the last 72 hours. Fasting Lipid Panel: No results for input(s): CHOL, HDL, LDLCALC, TRIG, CHOLHDL, LDLDIRECT in the last 72 hours. Thyroid Function Tests: No results for input(s): TSH, T4TOTAL, T3FREE, THYROIDAB in the last 72 hours.  Invalid input(s): FREET3 Anemia Panel: No results for input(s): VITAMINB12, FOLATE, FERRITIN, TIBC, IRON, RETICCTPCT in the last 72 hours.  RADIOLOGY: Dg Chest 2 View  06/22/2014   CLINICAL DATA:  Pulmonary edema.  Pleural effusions.  EXAM: CHEST  2 VIEW  COMPARISON:  Chest CT dated 06/19/2014  FINDINGS: The small effusions have almost completely resolved. The bilateral pulmonary edema persists at the lung bases.  Heart size and pulmonary vascularity are normal. Slight thoracic scoliosis is unchanged.  IMPRESSION: 1. Persistent bibasilar pulmonary edema. 2. Almost complete resolution of the small bilateral effusions.   Electronically Signed   By: Francene Boyers M.D.   On: 06/22/2014 07:30   Ct Angio Chest Pe W/cm &/or Wo Cm  06/19/2014   CLINICAL DATA:  Shortness of breath, tachypnea. Crackles. Recent postpartum.  EXAM: CT ANGIOGRAPHY CHEST WITH CONTRAST  TECHNIQUE: Multidetector CT imaging of the chest was performed using the standard protocol during bolus administration of intravenous contrast. Multiplanar CT image reconstructions and MIPs were obtained to evaluate the vascular anatomy.  CONTRAST:  OMNIPAQUE IOHEXOL 350 MG/ML SOLN  COMPARISON:  None.  FINDINGS: There are no filling defects within the pulmonary arteries to suggest pulmonary embolus.  The thoracic aorta is normal in caliber. There is no evidence of dissection. Heart at the upper limits of normal in size. Small bilateral pleural effusions, right greater than left. No pericardial effusion. No mediastinal or hilar adenopathy.  There multi focal bilateral fluffy and ground-glass  opacities, lower lobe and basilar predominant distribution. More confluent opacities are seen within the dependent bilateral lower lobes. There is smooth septal thickening. Motion artifact seen at the lung bases.  No acute abnormality in the included upper abdomen. No osseous abnormalities.  Review of the MIP images confirms the above findings.  IMPRESSION: 1. No pulmonary embolus. 2. Small bilateral pleural effusions, right greater than left. Findings suggesting pulmonary edema. More confluent lower lobe opacities are likely confluent edema, less likely pneumonia or aspiration.   Electronically Signed   By: Rubye Oaks M.D.   On: 06/19/2014 05:38    PHYSICAL EXAM General: NAD Neck: JVP 8-9 cm, no thyromegaly or thyroid nodule.  Lungs: Clear to auscultation bilaterally with normal respiratory effort. CV: Nondisplaced PMI.  Heart regular S1/S2 with normal respirophasic split S2, no S3/S4, no murmur.  Trace ankle edema.   Abdomen: Soft, nontender, no hepatosplenomegaly, no distention.  Neurologic: Alert and oriented x 3.  Psych: Normal affect. Extremities: No clubbing or cyanosis.   TELEMETRY: Reviewed telemetry pt in NSR  ASSESSMENT AND PLAN: 29 yo with minimal PMH had normal vaginal delivery on 2/25.  On 2/26, she developed dyspnea and was found to be in CHF. 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  We have been gradually titrating up her meds.  She still had some dyspnea overnight last night, probably some mild volume overload.  - Lasix 40 mg IV x 1 today (already got po Lasix) and follow I/Os and weight.  - Continue current Coreg and lisinopril, BP soft at times.  She has decided not to breast feed so ACEI use should not be an issue.  - Will increase spironolactone to 25 mg daily.  - We discussed the natural history of peri-partum cardiomyopathy.  She understands the need at this point for a reliable form of contraception.  I recommended IUD.  -  Possible home tomorrow if she is doing well. Needs to walk in halls today.   Marca Ancona 06/24/2014 11:27 AM

## 2014-06-24 NOTE — Progress Notes (Signed)
Heart Failure Navigator Consult Note  Presentation: Cassie Lawrence  is a 29 y/o morbidly obese woman with no significant PMH. Whom we are asked to see due to acute HF in setting of peri-partum CM.   She had her first child 2 years ago without difficulty. During this pregnancy developed some SOB and edema starting about 5-6 months but this waxed and waned. Over last week or two BP has been slightly high 120-130/90s. She was seen in clinic and u/s showed IUGR with reduced amniotic fluid so she was taken for vaginal deliver on 06/18/14.  This am tachypneic and tachycardic with acute SOB. Chest CT suggestive of CHF. Echo with mildly dilated LV. EF 20-25%. We are consulted for management of acute HF.   She has received two doses of IV lasix with over 3L out. Now feels much better. Denies dyspnea. HR 90s. BP 130/80. No FHx of familial CM     Past Medical History  Diagnosis Date  . H/O chlamydia infection   . History of gonorrhea   . Trichomonas infection   . H/O candidiasis   . H/O varicella   . No pertinent past medical history     History   Social History  . Marital Status: Single    Spouse Name: N/A  . Number of Children: N/A  . Years of Education: N/A   Social History Main Topics  . Smoking status: Never Smoker   . Smokeless tobacco: Never Used  . Alcohol Use: No  . Drug Use: No  . Sexual Activity: Not on file   Other Topics Concern  . None   Social History Narrative    ECHO:Study Conclusions--06/19/14  - Procedure narrative: Transthoracic echocardiography. Image quality was adequate. The study was technically difficult. - Left ventricle: The cavity size was mildly dilated. Wall thickness was increased in a pattern of mild LVH. Systolic function was severely reduced. The estimated ejection fraction was in the range of 20% to 25%. - Pericardium, extracardiac: A trivial pericardial effusion was identified. There was a left pleural effusion.  Transthoracic  echocardiography. M-mode, complete 2D, spectral Doppler, and color Doppler. Birthdate: Patient birthdate: [birth year]. Age: Patient is 29 yr old. Sex: Gender: female. BMI: 35.6 kg/m^2. Blood pressure:   128/96 Patient status: Inpatient. Study date: Study date: 06/19/2014. Study time: 03:39 PM. Location: Bedside.  BNP    Component Value Date/Time   BNP 997.7* 06/20/2014 1300    ProBNP No results found for: PROBNP   Education Assessment and Provision:  Detailed education and instructions provided on heart failure disease management including the following:  Signs and symptoms of Heart Failure When to call the physician Importance of daily weights Low sodium diet Fluid restriction Medication management Anticipated future follow-up appointments  Patient education given on each of the above topics.  Patient acknowledges understanding and acceptance of all instructions.  I discussed the new diagnosis of HF with Cassie Lawrence.  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  We discussed the importance of daily weights and when to call the physician.  I also described a low sodium diet and high sodium foods to avoid.  She asked appropriate questions and seemed motivated to comply.  She claims that she will not have any problems getting or taking medications as prescribed.  She is a bit overwhelmed with the prospect of a new infant and now issues with her own health.  She was able to teach back topics listed above.  I encouraged her to call with questions or concerns after discharge.  Education Materials:  "Living Better With Heart Failure" Booklet, Daily Weight Tracker Tool    High Risk Criteria for Readmission and/or Poor Patient Outcomes:  (Recommend Follow-up with [facility])--yes would benefit from close follow-up   EF <30%- Yes--20-25%  2 or more admissions in 6 months- No new HF  Difficult social situation- No  Lives with boyfriend  Demonstrates medication noncompliance- No    Barriers of Care:  Knowledge and compliance  Discharge Planning:   Plans to return home with boyfriend infant and 69 year old child.  She will need ongoing education.

## 2014-06-24 NOTE — Telephone Encounter (Signed)
Called patient in her room to follow-up on current hospital course. Patient is in good spirits. Reports feeling better but still experiences episodes of dyspnea. Has discontinued breastfeeding: reviewed recommendations to decrease discomfort such as wearing a tight fitting sports bra 24 hours per day, using Tylenol or mild pain medication and use of cabbage leaves as needed. Reassured patient we would remain available at all times for her. Also called the desk to see if a small bassinet from pediatrics could be moved to her room for the baby.

## 2014-06-24 NOTE — Progress Notes (Signed)
The patient arrived to 3E10 from [facility] at 2215.  She was oriented to the unit and placed on telemetry.  CCMD was notified.  She is A&Ox4 and is up ad lib.  She does not have any complaints of pain or SOB at this time and her VS are stable.  The call bell was placed within reach.  The [facility] was called concerning her newborn baby, and the [facility] stated that the baby could stay as long as another family member stayed at all times.  The pt agreed to this.  The MD was notified of the patient's arrival.

## 2014-06-25 DIAGNOSIS — I5023 Acute on chronic systolic (congestive) heart failure: Secondary | ICD-10-CM | POA: Diagnosis present

## 2014-06-25 LAB — BASIC METABOLIC PANEL
Anion gap: 7 (ref 5–15)
BUN: 11 mg/dL (ref 6–23)
CALCIUM: 8.7 mg/dL (ref 8.4–10.5)
CHLORIDE: 105 mmol/L (ref 96–112)
CO2: 24 mmol/L (ref 19–32)
CREATININE: 0.89 mg/dL (ref 0.50–1.10)
GFR calc Af Amer: >90 mL/min (ref >90)
GFR calc non Af Amer: 87 mL/min — ABNORMAL LOW (ref >90)
Glucose, Bld: 84 mg/dL (ref 70–99)
Potassium: 3.4 mmol/L — ABNORMAL LOW (ref 3.5–5.1)
Sodium: 136 mmol/L (ref 135–145)

## 2014-06-25 MED ORDER — SPIRONOLACTONE 25 MG PO TABS: 25.0000 mg | ORAL_TABLET | Freq: Every day | ORAL | Status: DC

## 2014-06-25 MED ORDER — FUROSEMIDE 20 MG PO TABS: 40.0000 mg | ORAL_TABLET | Freq: Every day | ORAL | Status: DC

## 2014-06-25 MED ORDER — DIGOXIN 125 MCG PO TABS
0.1250 mg | ORAL_TABLET | Freq: Every day | ORAL | Status: DC
  Administered 2014-06-25: 0.125 mg via ORAL
  Filled 2014-06-25: qty 1

## 2014-06-25 MED ORDER — WITCH HAZEL-GLYCERIN EX PADS: 1.0000 "application " | MEDICATED_PAD | CUTANEOUS | Status: DC | PRN

## 2014-06-25 MED ORDER — CARVEDILOL 12.5 MG PO TABS: 6.2500 mg | ORAL_TABLET | Freq: Two times a day (BID) | ORAL | Status: DC

## 2014-06-25 MED ORDER — POTASSIUM CHLORIDE CRYS ER 20 MEQ PO TBCR: 20.0000 meq | EXTENDED_RELEASE_TABLET | Freq: Every day | ORAL | Status: DC

## 2014-06-25 MED ORDER — DIBUCAINE 1 % RE OINT: 1.0000 "application " | TOPICAL_OINTMENT | RECTAL | Status: DC | PRN

## 2014-06-25 MED ORDER — POTASSIUM CHLORIDE CRYS ER 20 MEQ PO TBCR
40.0000 meq | EXTENDED_RELEASE_TABLET | Freq: Once | ORAL | Status: AC
  Administered 2014-06-25: 40 meq via ORAL
  Filled 2014-06-25: qty 2

## 2014-06-25 MED ORDER — DIGOXIN 125 MCG PO TABS: 0.1250 mg | ORAL_TABLET | Freq: Every day | ORAL | Status: DC

## 2014-06-25 NOTE — Progress Notes (Addendum)
Telephone encounter 06/25/2014.  Patient reports feeling better today is about to get discharged to home by cardiologist. She is not breast-feeding anymore. She is being sent home on Lasix, Coreg, lisinopril, spironolactone. She will be following up with the cardiologist early next week. I advised patient to follow-up at [facility] office in about 2 weeks, office was alerted to contact her to make appointment. We reviewed hypertension and cardiomyopathy as well as postpartum precautions.    Dr. Sallye Ober.

## 2014-06-25 NOTE — Progress Notes (Signed)
Patient ID: Cassie Lawrence, female   DOB: Feb 12, 1986, 29 y.o.   MRN: [id number]   SUBJECTIVE: Feels better. Weight down. Denies dyspnea.   Scheduled Meds: . carvedilol  6.25 mg Oral BID WC  . enoxaparin (LOVENOX) injection  40 mg Subcutaneous Q24H  . furosemide  20 mg Oral Daily  . lisinopril  10 mg Oral Daily  . prenatal multivitamin  1 tablet Oral Daily  . spironolactone  25 mg Oral Daily   Continuous Infusions:  PRN Meds:.albuterol, benzocaine-Menthol, witch hazel-glycerin **AND** dibucaine, diphenhydrAMINE, lanolin, oxyCODONE-acetaminophen  Filed Vitals:   06/24/14 1858 06/25/14 0226 06/25/14 0629 06/25/14 0851  BP: 114/74 111/84 117/81 111/76  Pulse: 102 95 90 96  Temp: 99.7 F (37.6 C) 98.2 F (36.8 C) 98.4 F (36.9 C)   TempSrc: Oral Oral Oral   Resp: Height:      Weight:   90.7 kg (199 lb 15.3 oz)   SpO2: 95% 95% 97%     Intake/Output Summary (Last 24 hours) at 06/25/14 1407 Last data filed at 06/25/14 1018  Gross per 24 hour  Intake    460 ml  Output   2400 ml  Net  -1940 ml    LABS: Basic Metabolic Panel:  Recent Labs  40/98/11 1230 06/25/14 0430  NA 142 136  K 3.9 3.4*  CL 105 105  CO2 29 24  GLUCOSE 91 84  BUN 11 11  CREATININE 0.81 0.89  CALCIUM 9.5 8.7   Liver Function Tests:  Recent Labs  06/23/14 0520  AST 80*  ALT 79*  ALKPHOS 151*  BILITOT 0.6  PROT 7.0  ALBUMIN 3.1*   No results for input(s): LIPASE, AMYLASE in the last 72 hours. CBC:  Recent Labs  06/23/14 0520  WBC 13.6*  HGB 14.2  HCT 42.4  MCV 92.0  PLT 232   Cardiac Enzymes: No results for input(s): CKTOTAL, CKMB, CKMBINDEX, TROPONINI in the last 72 hours. BNP: Invalid input(s): POCBNP D-Dimer: No results for input(s): DDIMER in the last 72 hours. Hemoglobin A1C: No results for input(s): HGBA1C in the last 72 hours. Fasting Lipid Panel: No results for input(s): CHOL, HDL, LDLCALC, TRIG, CHOLHDL, LDLDIRECT in the last 72 hours. Thyroid Function  Tests: No results for input(s): TSH, T4TOTAL, T3FREE, THYROIDAB in the last 72 hours.  Invalid input(s): FREET3 Anemia Panel: No results for input(s): VITAMINB12, FOLATE, FERRITIN, TIBC, IRON, RETICCTPCT in the last 72 hours.  RADIOLOGY: Dg Chest 2 View  06/22/2014   CLINICAL DATA:  Pulmonary edema.  Pleural effusions.  EXAM: CHEST  2 VIEW  COMPARISON:  Chest CT dated 06/19/2014  FINDINGS: The small effusions have almost completely resolved. The bilateral pulmonary edema persists at the lung bases.  Heart size and pulmonary vascularity are normal. Slight thoracic scoliosis is unchanged.  IMPRESSION: 1. Persistent bibasilar pulmonary edema. 2. Almost complete resolution of the small bilateral effusions.   Electronically Signed   By: Francene Boyers M.D.   On: 06/22/2014 07:30   Ct Angio Chest Pe W/cm &/or Wo Cm  06/19/2014   CLINICAL DATA:  Shortness of breath, tachypnea. Crackles. Recent postpartum.  EXAM: CT ANGIOGRAPHY CHEST WITH CONTRAST  TECHNIQUE: Multidetector CT imaging of the chest was performed using the standard protocol during bolus administration of intravenous contrast. Multiplanar CT image reconstructions and MIPs were obtained to evaluate the vascular anatomy.  CONTRAST:  OMNIPAQUE IOHEXOL 350 MG/ML SOLN  COMPARISON:  None.  FINDINGS: There are no filling defects within the  pulmonary arteries to suggest pulmonary embolus.  The thoracic aorta is normal in caliber. There is no evidence of dissection. Heart at the upper limits of normal in size. Small bilateral pleural effusions, right greater than left. No pericardial effusion. No mediastinal or hilar adenopathy.  There multi focal bilateral fluffy and ground-glass opacities, lower lobe and basilar predominant distribution. More confluent opacities are seen within the dependent bilateral lower lobes. There is smooth septal thickening. Motion artifact seen at the lung bases.  No acute abnormality in the included upper abdomen. No osseous  abnormalities.  Review of the MIP images confirms the above findings.  IMPRESSION: 1. No pulmonary embolus. 2. Small bilateral pleural effusions, right greater than left. Findings suggesting pulmonary edema. More confluent lower lobe opacities are likely confluent edema, less likely pneumonia or aspiration.   Electronically Signed   By: Rubye Oaks M.D.   On: 06/19/2014 05:38    PHYSICAL EXAM General: NAD Neck: JVP 6 cm, no thyromegaly or thyroid nodule.  Lungs: Clear to auscultation bilaterally with normal respiratory effort. CV: Nondisplaced PMI.  Heart regular S1/S2 with normal respirophasic split S2, no S3/S4, no murmur.  Trace ankle edema.   Abdomen: Soft, nontender, no hepatosplenomegaly, no distention.  Neurologic: Alert and oriented x 3.  Psych: Normal affect. Extremities: No clubbing or cyanosis.   TELEMETRY: Reviewed telemetry pt in NSR 80sat rest  120 with walking  ASSESSMENT AND PLAN: 29 yo with minimal PMH had normal vaginal delivery on 2/25.  On 2/26, she developed dyspnea and was found to be in CHF. 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    Much improved today. Can go home:  D/c meds  Lasix 40 daily kcl 20 daily Spiro 25 daily Carvedilol 6.25 bid Lisinopril 10 daily Digoxin 0.125 daily   Arvilla Meres MD 06/25/2014 2:07 PM

## 2014-06-27 NOTE — Discharge Summary (Signed)
Advanced Heart Failure Team  Discharge Summary   Patient ID: Cassie Lawrence MRN: [id number], DOB/AGE: 09/10/1985 29 y.o. Admit date: 06/22/2014 D/C date:     06/27/2014   Primary Discharge Diagnoses:  1. 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 An ECHO was performed and showed EF 20-25%. She was diuresed with IV lasix and and transitioned to po lasix. Overall she diuresed 10 pounds. After lengthy discussion she elected to not breast feed. She was placed low dose coreg, lisinopril, spironolactone and lasix.   She was understands she needs a reliable form of contraception with recommendation for IUD. She will follow up with OB-GYN.  She will continue to be followed closely in the HF clinic for medication titration and has follow up next week.   On the day discharge she was evaluated by Dr Gala Romney and deemed stable for discharge home.   Discharge Weight Range:  199 pounds.  Discharge Vitals: Blood pressure 111/76, pulse 76, temperature 98.4 F (36.9 C), temperature source Oral, resp. rate 19, height 5' 5.5" (1.664 m), weight 199 lb 15.3 oz (90.7 kg), SpO2 97 %, 29 currently breastfeeding.  Labs: Lab Results  Component Value Date   WBC 13.6* 06/23/2014   HGB 14.2 06/23/2014   HCT 42.4 06/23/2014   MCV 92.0 06/23/2014   PLT 232 06/23/2014    Recent Labs Lab 06/23/14 0520  06/25/14 0430  NA 139  135  < > 136  K 4.2  4.0  < > 3.4*  CL 107  111  < > 105  CO2 22  22  < > 24  BUN 13  16  < > 11  CREATININE 0.77  0.67  < > 0.89  CALCIUM 9.0  8.4  < > 8.7  PROT 7.0  --   --   BILITOT 0.6  --   --   ALKPHOS 151*  --   --   ALT 79*  --   --   AST 80*  --   --   GLUCOSE 87  96  < > 84  < > = values in this interval not displayed. No results found for: CHOL, HDL, LDLCALC, TRIG BNP (last  3 results)  Recent Labs  06/20/14 1300 06/24/14 1230  BNP 997.7* 1130.9*    ProBNP (last 3 results) No results for input(s): PROBNP in the last 8760 hours.   Diagnostic Studies/Procedures   No results found.  Discharge Medications     Medication List    STOP taking these medications        albuterol (2.5 MG/3ML) 0.083% nebulizer solution  Commonly known as:  PROVENTIL     ibuprofen 600 MG tablet  Commonly known as:  ADVIL,MOTRIN      TAKE these medications        carvedilol 12.5 MG tablet  Commonly known as:  COREG  Take 0.5 tablets (6.25 mg total) by mouth 2 (two) times daily with a meal.     dibucaine 1 % Oint  Commonly known as:  NUPERCAINAL  Place 1 application rectally as needed for hemorrhoids.     digoxin 0.125 MG tablet  Commonly known as:  LANOXIN  Take 1 tablet (0.125 mg total) by mouth daily.     furosemide 20 MG tablet  Commonly known as:  LASIX  Take 2 tablets (40 mg total) by mouth daily.     lisinopril 10 MG tablet  Commonly known as:  PRINIVIL,ZESTRIL  Take 1 tablet (10 mg total) by mouth daily.     oxyCODONE-acetaminophen 5-325 MG per tablet  Commonly known as:  PERCOCET/ROXICET  Take 1 tablet by mouth every 4 (four) hours as needed (for pain scale less than 7).     potassium chloride SA 20 MEQ tablet  Commonly known as:  K-DUR,KLOR-CON  Take 1 tablet (20 mEq total) by mouth daily.     prenatal multivitamin Tabs tablet  Take 1 tablet by mouth daily.     spironolactone 25 MG tablet  Commonly known as:  ALDACTONE  Take 1 tablet (25 mg total) by mouth daily.     witch hazel-glycerin pad  Commonly known as:  TUCKS  Apply 1 application topically as needed for hemorrhoids.        Disposition   The patient will be discharged in stable condition to home.     Discharge Instructions    (HEART FAILURE PATIENTS) Call MD:  Anytime you have any of the following symptoms: 1) 3 pound weight gain in 24 hours or 5 pounds in 1 week 2)  shortness of breath, with or without a dry hacking cough 3) swelling in the hands, feet or stomach 4) if you have to sleep on extra pillows at night in order to breathe.    Complete by:  As directed      ACE Inhibitor / ARB already ordered    Complete by:  As directed      Diet - low sodium heart healthy    Complete by:  As directed      Heart Failure patients record your daily weight using the same scale at the same time of day    Complete by:  As directed      Increase activity slowly    Complete by:  As directed           Follow-up Information    Follow up with [facility]. Schedule an appointment as soon as possible for a visit on 07/03/2014.   Specialty:  Obstetrics and Gynecology   Why:  :30 am spoke with Jamesetta So Please call if you have any questions or concerns, prior to your next visit., If symptoms worsen   Contact information:   [address] [place] [address] [phone]      Follow up with Arvilla Meres, MD On 07/02/2014.   Specialty:  Cardiology   Why:  at 11:30 Garage Code [id number]   Contact information:   [address] [place] [address] [phone]         Duration of Discharge Encounter: Greater than 35 minutes   Signed, CLEGG,AMY  06/27/2014, 11:49 AM  Patient seen and examined with Tonye Becket, NP. We discussed all aspects of the encounter. I agree with the assessment and plan as stated above.   She is improved and ready for d/c. Will follow closely in HF Clinic. Med changes as noted above.   Sophiamarie Nease,MD 5:15 PM

## 2014-07-02 ENCOUNTER — Encounter (HOSPITAL_COMMUNITY): Payer: Self-pay

## 2014-07-02 ENCOUNTER — Ambulatory Visit (HOSPITAL_COMMUNITY)
Admission: RE | Admit: 2014-07-02 | Discharge: 2014-07-02 | Disposition: A | Discharge status: Home / Self Care | Payer: 59 | Source: Ambulatory Visit | Attending: Internal Medicine | Admitting: Internal Medicine

## 2014-07-02 VITALS — BP 114/68 | HR 80 | Wt 192.5 lb

## 2014-07-02 DIAGNOSIS — I5022 Chronic systolic (congestive) heart failure: Secondary | ICD-10-CM

## 2014-07-02 DIAGNOSIS — R0600 Dyspnea, unspecified: Secondary | ICD-10-CM

## 2014-07-02 DIAGNOSIS — I5023 Acute on chronic systolic (congestive) heart failure: Secondary | ICD-10-CM

## 2014-07-02 DIAGNOSIS — Z79899 Other long term (current) drug therapy: Secondary | ICD-10-CM | POA: Diagnosis not present

## 2014-07-02 DIAGNOSIS — O903 Peripartum cardiomyopathy: Secondary | ICD-10-CM

## 2014-07-02 LAB — BASIC METABOLIC PANEL
Anion gap: 6 (ref 5–15)
BUN: 13 mg/dL (ref 6–23)
CHLORIDE: 103 mmol/L (ref 96–112)
CO2: 28 mmol/L (ref 19–32)
CREATININE: 0.91 mg/dL (ref 0.50–1.10)
Calcium: 9.7 mg/dL (ref 8.4–10.5)
GFR calc non Af Amer: 84 mL/min — ABNORMAL LOW (ref >90)
GLUCOSE: 89 mg/dL (ref 70–99)
POTASSIUM: 4.1 mmol/L (ref 3.5–5.1)
Sodium: 137 mmol/L (ref 135–145)

## 2014-07-02 LAB — BRAIN NATRIURETIC PEPTIDE: B NATRIURETIC PEPTIDE 5: 332.6 pg/mL — AB (ref 0.0–100.0)

## 2014-07-02 LAB — DIGOXIN LEVEL: DIGOXIN LVL: 0.2 ng/mL — AB (ref 0.8–2.0)

## 2014-07-02 MED ORDER — POTASSIUM CHLORIDE CRYS ER 20 MEQ PO TBCR: 20.0000 meq | EXTENDED_RELEASE_TABLET | Freq: Every day | ORAL | Status: DC | PRN

## 2014-07-02 MED ORDER — LISINOPRIL 10 MG PO TABS: ORAL_TABLET | ORAL | Status: DC

## 2014-07-02 MED ORDER — FUROSEMIDE 20 MG PO TABS: 40.0000 mg | ORAL_TABLET | Freq: Every day | ORAL | Status: DC | PRN

## 2014-07-02 NOTE — Progress Notes (Signed)
Patient ID: Cassie Lawrence, female   DOB: [birth year], 29 y.o.   MRN: [id number] PCP: Primary Cardiologist: Dr Gala Romney OB GYN: Dr Normand Sloop  HPI: Ms Vahle is a 29 year old with a history of systolic HF due to post-partum CM diagnosed in 2/16  Undwerwent normal vaginal delivery on 2/25 but the next day she developed dyspnea and was found to be in CHF . An ECHO was performed and showed EF 20-25%. She was diuresed with IV lasix and and transitioned to po lasix. Overall she diuresed 10 pounds. After lengthy discussion she elected to not breast feed. She was readmitted last week. HF meds adjusted including decreasing carvedilol.Discharge weight 199 pounds.   She returns for post hospital follow up. Weight at home 189 pounds. Denies SOB/PND/Orthopnea. Feels great.  Has follow up with OB-GYN next week. Taking all medications. Has been taking 12.5 mg spironolactone daily.    ECHO 06/19/2014 EF 20-25%   Labs K 3.4 Creatinine 0.89    ROS: All systems negative except as listed in HPI, PMH and Problem List.  SH:  History   Social History  . Marital Status: Single    Spouse Name: N/A  . Number of Children: N/A  . Years of Education: N/A   Occupational History  . Not on file.   Social History Main Topics  . Smoking status: Never Smoker   . Smokeless tobacco: Never Used  . Alcohol Use: No  . Drug Use: No  . Sexual Activity: Not on file   Other Topics Concern  . Not on file   Social History Narrative    FH:  Family History  Problem Relation Age of Onset  . Hypertension Mother   . Heart disease Maternal Grandmother   . Asthma Maternal Grandmother   . COPD Maternal Grandmother   . Diabetes Maternal Grandmother   . Diabetes Maternal Grandfather     Past Medical History  Diagnosis Date  . H/O chlamydia infection   . History of gonorrhea   . Trichomonas infection   . H/O candidiasis   . H/O varicella   . No pertinent past medical history     Current Outpatient Prescriptions   Medication Sig Dispense Refill  . carvedilol (COREG) 12.5 MG tablet Take 0.5 tablets (6.25 mg total) by mouth 2 (two) times daily with a meal. 60 tablet 6  . digoxin (LANOXIN) 0.125 MG tablet Take 1 tablet (0.125 mg total) by mouth daily. 30 tablet 6  . furosemide (LASIX) 20 MG tablet Take 2 tablets (40 mg total) by mouth daily. 60 tablet 6  . lisinopril (PRINIVIL,ZESTRIL) 10 MG tablet Take 1 tablet (10 mg total) by mouth daily. 30 tablet 0  . potassium chloride SA (K-DUR,KLOR-CON) 20 MEQ tablet Take 1 tablet (20 mEq total) by mouth daily. 30 tablet 6  . Prenatal Vit-Fe Fumarate-FA (PRENATAL MULTIVITAMIN) TABS Take 1 tablet by mouth daily.    Marland Kitchen oxyCODONE-acetaminophen (PERCOCET/ROXICET) 5-325 MG per tablet Take 1 tablet by mouth every 4 (four) hours as needed (for pain scale less than 7). (Patient not taking: Reported on 07/02/2014) 30 tablet 0  . spironolactone (ALDACTONE) 25 MG tablet Take 1 tablet (25 mg total) by mouth daily. (Patient taking differently: Take 12.5 mg by mouth daily. ) 30 tablet 6   No current facility-administered medications for this encounter.    Filed Vitals:   07/02/14 1135  BP: 114/68  Pulse: 80  Weight: 192 lb 8 oz (87.317 kg)  SpO2: 98%    PHYSICAL EXAM:  General:  Well appearing. No resp difficulty HEENT: normal Neck: supple. JVP flat. Carotids 2+ bilaterally; no bruits. No lymphadenopathy or thryomegaly appreciated. Cor: PMI normal. Regular rate & rhythm. No rubs, gallops or murmurs. Lungs: clear Abdomen: soft, nontender, nondistended. No hepatosplenomegaly. No bruits or masses. Good bowel sounds. Extremities: no cyanosis, clubbing, rash, edema Neuro: alert & orientedx3, cranial nerves grossly intact. Moves all 4 extremities w/o difficulty. Affect pleasant.      ASSESSMENT & PLAN:  1. Chronic Systolic HF. Peri Partum NICM. ECHO 20-25%.  NYHA II. Volume status stable. Change lasix and potassium as needed  Continue 12.5 mg spironolactone daily.   Continue carvedilol 6.25 mg twice daily Continue lisinopril  10 mg in am and add 5 mg in pm Check ECHO In 3 months. Check BMET, dig level,  and BNP  She has follow up with ob-gyn for IUD next week.   Follow up in 2-3 weeks.   CLEGG,AMY NP-C  12:09 PM  Patient seen and examined with Tonye Becket, NP. We discussed all aspects of the encounter. I agree with the assessment and plan as stated above.   She looks great. Doing very well. Volume status good. S3 no longer present. Agree with lisinopril. Can use lasix on PRN basis. Check labs. Stressed need for IUD. Hopefully she will recover quickly. Will do bedside echo at next visit.   Daniel Bensimhon,MD 1:28 AM

## 2014-07-02 NOTE — Patient Instructions (Addendum)
CONTINUE Lisinopril 10mg  (1 tablet) during daytime. ADD Lisinopril 5mg  (1/2 tab) in afternoon.  CHANGE Lasix and potassium to AS NEEDED. If weight gain 3 lbs or more overnight, take 40mg  of lasix with 20 meq of potassium once that day.  Follow up 3 weeks.  Do the following things EVERYDAY: 1) Weigh yourself in the morning before breakfast. Write it down and keep it in a log. 2) Take your medicines as prescribed 3) Eat low salt foods-Limit salt (sodium) to 2000 mg per day.  4) Stay as active as you can everyday 5) Limit all fluids for the day to less than 2 liters

## 2014-07-03 ENCOUNTER — Inpatient Hospital Stay (HOSPITAL_COMMUNITY): Payer: 59

## 2014-07-03 ENCOUNTER — Telehealth (HOSPITAL_COMMUNITY): Payer: Self-pay | Admitting: Vascular Surgery

## 2014-07-03 DIAGNOSIS — I5022 Chronic systolic (congestive) heart failure: Secondary | ICD-10-CM | POA: Insufficient documentation

## 2014-07-03 NOTE — Telephone Encounter (Signed)
Will forward to provider to review Unsure if he would like her to jump to the increased dose or restart at previous dose

## 2014-07-03 NOTE — Telephone Encounter (Signed)
PT called she was seen yesterday she told Dr. Jesusita Oka that she was taking lisinopril . She notice later that she had not taken Lisinipril in a week and Dan increased her dosage , pt wants to know if she still needs to increase medication.. Please advise

## 2014-07-04 NOTE — Telephone Encounter (Signed)
Just take 10mg  daily

## 2014-07-06 MED ORDER — LISINOPRIL 10 MG PO TABS: 10.0000 mg | ORAL_TABLET | Freq: Every day | ORAL | Status: DC

## 2014-07-06 NOTE — Telephone Encounter (Signed)
Pt aware and voiced understanding Advised medication titration will more likely come at next office visit  meds adjusted/changed on medication list to reflect dose change

## 2014-07-24 ENCOUNTER — Encounter (HOSPITAL_COMMUNITY): Payer: Self-pay

## 2014-07-24 ENCOUNTER — Ambulatory Visit (HOSPITAL_COMMUNITY)
Admission: RE | Admit: 2014-07-24 | Discharge: 2014-07-24 | Disposition: A | Discharge status: Home / Self Care | Payer: 59 | Source: Ambulatory Visit | Attending: Cardiology | Admitting: Cardiology

## 2014-07-24 VITALS — BP 113/67 | HR 62 | Resp 18 | Wt 190.5 lb

## 2014-07-24 DIAGNOSIS — I5022 Chronic systolic (congestive) heart failure: Secondary | ICD-10-CM

## 2014-07-24 DIAGNOSIS — O903 Peripartum cardiomyopathy: Secondary | ICD-10-CM | POA: Insufficient documentation

## 2014-07-24 DIAGNOSIS — Z79899 Other long term (current) drug therapy: Secondary | ICD-10-CM | POA: Insufficient documentation

## 2014-07-24 MED ORDER — SPIRONOLACTONE 25 MG PO TABS: 25.0000 mg | ORAL_TABLET | Freq: Every day | ORAL | Status: DC

## 2014-07-24 MED ORDER — FUROSEMIDE 20 MG PO TABS: 40.0000 mg | ORAL_TABLET | Freq: Every day | ORAL | Status: DC | PRN

## 2014-07-24 NOTE — Progress Notes (Signed)
Patient ID: Cassie Lawrence, female   DOB: [birth year], 29 y.o.   MRN: [id number] PCP: Primary Cardiologist: Dr Gala Romney OB GYN: Dr Normand Sloop  HPI: Cassie Lawrence is a 29 year old with a history of systolic HF due to post-partum CM diagnosed in 2/16  Undwerwent normal vaginal delivery on 2/25 but the next day she developed dyspnea and was found to be in CHF . An ECHO was performed and showed EF 20-25%. She was diuresed with IV lasix and and transitioned to po lasix. Overall she diuresed 10 pounds. After lengthy discussion she elected to not breast feed. She was readmitted last week. HF meds adjusted including decreasing carvedilol.Discharge weight 199 pounds.   She returns for hospital follow up. Weight at home 189 pounds. Takes lasix occasionally.  Denies SOB/PND/Orthopnea. Feels great. At last visit asked her to increase lisinopril but discovered she hadn't been taking. Restart previous dose of 10 daily. Has been taking carvedilol 6.25 mg bid and 12.5 mg spironolactone daily. No dizziness or edema.  Had IUD placed. BP at OB was in low 90s. Bedside echo done EF ~35-40%   ECHO 06/19/2014 EF 20-25%   Labs K 3.4 Creatinine 0.89  Labs (3/10) k 4.1 creatinine 0.9 dig 0.2   ROS: All systems negative except as listed in HPI, PMH and Problem List.  SH:  History   Social History  . Marital Status: Single    Spouse Name: Cassie Lawrence  . Number of Children: Cassie Lawrence  . Years of Education: Cassie Lawrence   Occupational History  . Not on file.   Social History Main Topics  . Smoking status: Never Smoker   . Smokeless tobacco: Never Used  . Alcohol Use: No  . Drug Use: No  . Sexual Activity: Not on file   Other Topics Concern  . Not on file   Social History Narrative    FH:  Family History  Problem Relation Age of Onset  . Hypertension Mother   . Heart disease Maternal Grandmother   . Asthma Maternal Grandmother   . COPD Maternal Grandmother   . Diabetes Maternal Grandmother   . Diabetes Maternal Grandfather      Past Medical History  Diagnosis Date  . H/O chlamydia infection   . History of gonorrhea   . Trichomonas infection   . H/O candidiasis   . H/O varicella   . No pertinent past medical history     Current Outpatient Prescriptions  Medication Sig Dispense Refill  . carvedilol (COREG) 12.5 MG tablet Take 0.5 tablets (6.25 mg total) by mouth 2 (two) times daily with a meal. 60 tablet 6  . digoxin (LANOXIN) 0.125 MG tablet Take 1 tablet (0.125 mg total) by mouth daily. 30 tablet 6  . furosemide (LASIX) 20 MG tablet Take 2 tablets (40 mg total) by mouth daily as needed. Weight gain 3 lb or more overnight. 20 tablet 3  . lisinopril (PRINIVIL,ZESTRIL) 10 MG tablet Take 1 tablet (10 mg total) by mouth daily. 30 tablet 3  . potassium chloride SA (K-DUR,KLOR-CON) 20 MEQ tablet Take 1 tablet (20 mEq total) by mouth daily as needed. 15 tablet 3  . Prenatal Vit-Fe Fumarate-FA (PRENATAL MULTIVITAMIN) TABS Take 1 tablet by mouth daily.    Marland Kitchen spironolactone (ALDACTONE) 25 MG tablet Take 1 tablet (25 mg total) by mouth daily. (Patient taking differently: Take 12.5 mg by mouth daily. ) 30 tablet 6   No current facility-administered medications for this encounter.    Filed Vitals:   07/24/14 6962  BP: 113/67  Pulse: 62  Resp: 18  Weight: 190 lb 8 oz (86.41 kg)  SpO2: 97%    PHYSICAL EXAM:  General:  Well appearing. No resp difficulty HEENT: normal Neck: supple. JVP flat. Carotids 2+ bilaterally; no bruits. No lymphadenopathy or thryomegaly appreciated. Cor: PMI normal. Regular rate & rhythm. No rubs, gallops or murmurs. Lungs: clear Abdomen: soft, nontender, nondistended. No hepatosplenomegaly. No bruits or masses. Good bowel sounds. Extremities: no cyanosis, clubbing, rash, edema Neuro: alert & orientedx3, cranial nerves grossly intact. Moves all 4 extremities w/o difficulty. Affect pleasant.   ASSESSMENT & PLAN:  1. Chronic Systolic HF. Peri Partum NICM. ECHO 20-25%. Bedside echo done  EF ~35-40% -Looks great. NYHA I Volume status stable. HR much improved. -Increase spironolactone to 25 mg  daily.  - Stop digoxin -Continue carvedilol 6.25 mg twice daily -Continue lisinopril  10 mg in am  -Stop digoxin -Use lasix prn only  F/u 4 weeks  Jalayna Josten,MD 10:34 AM

## 2014-07-24 NOTE — Patient Instructions (Signed)
INCREASE Spironolactone to 25 mg daily STOP Digoxin CONTINUE Lasix and Potassium AS NEEDED  Call office if SBP is less than 90 or you have dizziness  Your physician recommends that you schedule a follow-up appointment in: 4 weeks

## 2014-07-24 NOTE — Addendum Note (Signed)
Encounter addended by: Theresia Bough, CMA on: 07/24/2014 10:54 AM<BR>     Documentation filed: Medications, Patient Instructions Section, Orders

## 2014-08-21 ENCOUNTER — Ambulatory Visit (HOSPITAL_COMMUNITY)
Admission: RE | Admit: 2014-08-21 | Discharge: 2014-08-21 | Disposition: A | Discharge status: Home / Self Care | Payer: 59 | Source: Ambulatory Visit | Attending: Cardiology | Admitting: Cardiology

## 2014-08-21 VITALS — BP 112/80 | HR 81 | Wt 192.0 lb

## 2014-08-21 DIAGNOSIS — I5022 Chronic systolic (congestive) heart failure: Secondary | ICD-10-CM | POA: Diagnosis not present

## 2014-08-21 DIAGNOSIS — I5023 Acute on chronic systolic (congestive) heart failure: Secondary | ICD-10-CM | POA: Diagnosis not present

## 2014-08-21 LAB — BASIC METABOLIC PANEL
ANION GAP: 9 (ref 5–15)
BUN: 10 mg/dL (ref 6–23)
CO2: 25 mmol/L (ref 19–32)
Calcium: 9.7 mg/dL (ref 8.4–10.5)
Chloride: 106 mmol/L (ref 96–112)
Creatinine, Ser: 0.69 mg/dL (ref 0.50–1.10)
GFR calc Af Amer: >90 mL/min (ref >90)
Glucose, Bld: 70 mg/dL (ref 70–99)
POTASSIUM: 3.6 mmol/L (ref 3.5–5.1)
Sodium: 140 mmol/L (ref 135–145)

## 2014-08-21 MED ORDER — CARVEDILOL 6.25 MG PO TABS: 9.3750 mg | ORAL_TABLET | Freq: Two times a day (BID) | ORAL | Status: DC

## 2014-08-21 NOTE — Patient Instructions (Addendum)
Please increase Coreg to  9.375 twice daily Labs today Follow-up 6 weeks

## 2014-08-23 NOTE — Progress Notes (Signed)
Patient ID: Cassie Lawrence, female   DOB: 06-21-1985, 29 y.o.   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  Underwent normal vaginal delivery on 06/18/14 but the next day she developed dyspnea and was found to be in CHF . An ECHO was performed and showed EF 20-25%. She was diuresed with IV lasix and and transitioned to po lasix. Overall she diuresed 10 pounds. After lengthy discussion she elected to not breast feed. She was readmitted shortly after this. HF meds adjusted including decreasing carvedilol.  Discharge weight 199 pounds.   She returns for followup.  Doing well overall.  No exertional dyspnea.  No orthopnea or PND.  She has taken only 1 dose Lasix since last appointment.  She now has an IUD.  Weight is stable.   ECHO 06/19/2014 EF 20-25%   Labs (2/16): K 3.4 Creatinine 0.89  Labs (3/16): K 4.1, creatinine 0.91, BNP 332  ROS: All systems negative except as listed in HPI, PMH and Problem List.  SH:  History   Social History  . Marital Status: Single    Spouse Name: N/A  . Number of Children: N/A  . Years of Education: N/A   Occupational History  . Not on file.   Social History Main Topics  . Smoking status: Never Smoker   . Smokeless tobacco: Never Used  . Alcohol Use: No  . Drug Use: No  . Sexual Activity: Not on file   Other Topics Concern  . Not on file   Social History Narrative    FH:  Family History  Problem Relation Age of Onset  . Hypertension Mother   . Heart disease Maternal Grandmother   . Asthma Maternal Grandmother   . COPD Maternal Grandmother   . Diabetes Maternal Grandmother   . Diabetes Maternal Grandfather     Past Medical History  Diagnosis Date  . H/O chlamydia infection   . History of gonorrhea   . Trichomonas infection   . H/O candidiasis   . H/O varicella   . No pertinent past medical history     Current Outpatient  Prescriptions  Medication Sig Dispense Refill  . furosemide (LASIX) 20 MG tablet Take 2 tablets (40 mg total) by mouth daily as needed. Weight gain 3 lb or more overnight. 20 tablet 3  . lisinopril (PRINIVIL,ZESTRIL) 10 MG tablet Take 1 tablet (10 mg total) by mouth daily. 30 tablet 3  . potassium chloride SA (K-DUR,KLOR-CON) 20 MEQ tablet Take 1 tablet (20 mEq total) by mouth daily as needed. 15 tablet 3  . Prenatal Vit-Fe Fumarate-FA (PRENATAL MULTIVITAMIN) TABS Take 1 tablet by mouth daily.    Marland Kitchen spironolactone (ALDACTONE) 25 MG tablet Take 1 tablet (25 mg total) by mouth daily. 30 tablet 6  . carvedilol (COREG) 6.25 MG tablet Take 1.5 tablets (9.375 mg total) by mouth 2 (two) times daily. 180 tablet 3   No current facility-administered medications for this encounter.    Filed Vitals:   08/21/14 1506  BP: 112/80  Pulse: 81  Weight: 192 lb (87.091 kg)  SpO2: 97%    PHYSICAL EXAM: General:  Well appearing. No resp difficulty HEENT: normal Neck: supple. JVP flat. Carotids 2+ bilaterally; no bruits. No lymphadenopathy or thryomegaly appreciated. Cor: PMI normal. Regular rate & rhythm. No rubs, gallops or murmurs. Lungs: clear Abdomen: soft, nontender, nondistended. No hepatosplenomegaly. No bruits or masses. Good  bowel sounds. Extremities: no cyanosis, clubbing, rash, edema Neuro: alert & orientedx3, cranial nerves grossly intact. Moves all 4 extremities w/o difficulty. Affect pleasant.  ASSESSMENT & PLAN: Chronic Systolic HF: Peri-partum cardiomyopathy. ECHO 20-25% on 2/16 echo.  NYHA II.  Volume status stable.  - Continue Lasix as needed. - Increase Coreg to 9.375 mg bid.  - Continue spironolactone 25 mg daily and lisinopril 10 mg daily.  - Repeat echo at 6 months in 8/16, if no improvement will need ICD (hopefully improves). - Now has IUD, reminded of risks from future pregnancy.  - BMET today, followup in 6 weeks for medication titration.   Marca Ancona 08/23/2014

## 2014-10-01 ENCOUNTER — Encounter (HOSPITAL_COMMUNITY): Payer: Self-pay

## 2014-10-01 ENCOUNTER — Ambulatory Visit (HOSPITAL_COMMUNITY)
Admission: RE | Admit: 2014-10-01 | Discharge: 2014-10-01 | Disposition: A | Discharge status: Home / Self Care | Payer: 59 | Source: Ambulatory Visit | Attending: Internal Medicine | Admitting: Internal Medicine

## 2014-10-01 VITALS — BP 96/58 | HR 63 | Wt 187.0 lb

## 2014-10-01 DIAGNOSIS — O903 Peripartum cardiomyopathy: Secondary | ICD-10-CM | POA: Diagnosis not present

## 2014-10-01 DIAGNOSIS — Z79899 Other long term (current) drug therapy: Secondary | ICD-10-CM | POA: Diagnosis not present

## 2014-10-01 DIAGNOSIS — Z8249 Family history of ischemic heart disease and other diseases of the circulatory system: Secondary | ICD-10-CM | POA: Insufficient documentation

## 2014-10-01 DIAGNOSIS — I5022 Chronic systolic (congestive) heart failure: Secondary | ICD-10-CM | POA: Insufficient documentation

## 2014-10-01 MED ORDER — LISINOPRIL 10 MG PO TABS: 10.0000 mg | ORAL_TABLET | Freq: Every day | ORAL | Status: DC

## 2014-10-01 NOTE — Patient Instructions (Signed)
Take Lisinopril at bedtime.  Follow up 6 weeks with echocardiogram.  Do the following things EVERYDAY: 1) Weigh yourself in the morning before breakfast. Write it down and keep it in a log. 2) Take your medicines as prescribed 3) Eat low salt foods-Limit salt (sodium) to 2000 mg per day.  4) Stay as active as you can everyday 5) Limit all fluids for the day to less than 2 liters

## 2014-10-01 NOTE — Progress Notes (Signed)
Patient ID: Cassie Lawrence, female   DOB: Jul 03, 1985, 29 y.o.   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  Underwent normal vaginal delivery on 06/18/14 but the next day she developed dyspnea and was found to be in CHF . An ECHO was performed and showed EF 20-25%. She was diuresed with IV lasix and and transitioned to po lasix. Overall she diuresed 10 pounds. After lengthy discussion she elected to not breast feed. She was readmitted shortly after this. HF meds adjusted including decreasing carvedilol.  Discharge weight 199 pounds.   She returns for followup.  Last visit carvedilol was increased to 9.375 mg twice a day. Has not had lasix in 6 weeks. Does admit to mild dyspnea walking.  Weight has been stable.  Busy at home with new born. She now has an IUD.  Working full time at [employer].  Not drinking much fluid.   ECHO 06/19/2014 EF 20-25%   Labs (2/16): K 3.4 Creatinine 0.89  Labs (3/16): K 4.1, creatinine 0.91, BNP 332 Labs (08/21/2014) : K 3.6 Creatinine 0.69  ROS: All systems negative except as listed in HPI, PMH and Problem List.  SH:  History   Social History  . Marital Status: Single    Spouse Name: N/A  . Number of Children: N/A  . Years of Education: N/A   Occupational History  . Not on file.   Social History Main Topics  . Smoking status: Never Smoker   . Smokeless tobacco: Never Used  . Alcohol Use: No  . Drug Use: No  . Sexual Activity: Not on file   Other Topics Concern  . Not on file   Social History Narrative    FH:  Family History  Problem Relation Age of Onset  . Hypertension Mother   . Heart disease Maternal Grandmother   . Asthma Maternal Grandmother   . COPD Maternal Grandmother   . Diabetes Maternal Grandmother   . Diabetes Maternal Grandfather     Past Medical History  Diagnosis Date  . H/O chlamydia infection   . History of gonorrhea   .  Trichomonas infection   . H/O candidiasis   . H/O varicella   . No pertinent past medical history     Current Outpatient Prescriptions  Medication Sig Dispense Refill  . carvedilol (COREG) 6.25 MG tablet Take 1.5 tablets (9.375 mg total) by mouth 2 (two) times daily. 180 tablet 3  . furosemide (LASIX) 20 MG tablet Take 2 tablets (40 mg total) by mouth daily as needed. Weight gain 3 lb or more overnight. 20 tablet 3  . lisinopril (PRINIVIL,ZESTRIL) 10 MG tablet Take 1 tablet (10 mg total) by mouth daily. 30 tablet 3  . potassium chloride SA (K-DUR,KLOR-CON) 20 MEQ tablet Take 1 tablet (20 mEq total) by mouth daily as needed. 15 tablet 3  . Prenatal Vit-Fe Fumarate-FA (PRENATAL MULTIVITAMIN) TABS Take 1 tablet by mouth daily.    Marland Kitchen spironolactone (ALDACTONE) 25 MG tablet Take 1 tablet (25 mg total) by mouth daily. 30 tablet 6   No current facility-administered medications for this encounter.    Filed Vitals:   10/01/14 1220  BP: 96/58  Pulse: 63  Weight: 187 lb (84.823 kg)  SpO2: 98%    PHYSICAL EXAM: General:  Well appearing. No resp difficulty HEENT: normal Neck: supple. JVP flat. Carotids 2+ bilaterally; no bruits. No lymphadenopathy or thryomegaly  appreciated. Cor: PMI normal. Regular rate & rhythm. No rubs, gallops or murmurs. Lungs: clear Abdomen: soft, nontender, nondistended. No hepatosplenomegaly. No bruits or masses. Good bowel sounds. Extremities: no cyanosis, clubbing, rash, edema Neuro: alert & orientedx3, cranial nerves grossly intact. Moves all 4 extremities w/o difficulty. Affect pleasant.  ASSESSMENT & PLAN: Chronic Systolic HF: Peri-partum cardiomyopathy. ECHO 20-25% on 2/16 echo.  NYHA II.  Volume status stable. - Continue Lasix as needed. BP soft and she has not had medicatons today.  - Continue Coreg to 9.375 mg bid.  - Continue spironolactone 25 mg daily  -Switch lisinopril to 10 mg at bed time.  Can switching to Entresto at next visit.  -Now has IUD,  reminded of risks from future pregnancy.  - Repeat echo at 6 weeks.   Follow up in 6 weeks.  CLEGG,AMY NP-C  10/01/2014

## 2014-10-27 ENCOUNTER — Telehealth (HOSPITAL_COMMUNITY): Payer: Self-pay | Admitting: *Deleted

## 2014-10-27 NOTE — Telephone Encounter (Signed)
Spiro and lisinopril approved through ins. ([facility])

## 2014-10-28 ENCOUNTER — Telehealth (HOSPITAL_COMMUNITY): Payer: Self-pay

## 2014-10-28 NOTE — Telephone Encounter (Signed)
Coreg has minimal effect on diabetes, would not stop.  Hair thinning can happen from many causes, would continue for now but can stop if EF back to normal on echo.  Try to schedule echo a little sooner, can back off on some meds (likely spironolactone) if EF is back to normal.

## 2014-10-28 NOTE — Telephone Encounter (Signed)
Patient calling c/o side effects of medications and would like to stop them. States she has had an increase in her HgbA1C which she was told was a side effect of Coreg. States she is having significant hair thinning/hair loss which she was told was a side effect of Spironolactone. States she has had severe generalized itching that is progressively becoming worse, unsure of which medication may be causing this. Has an appointment 7/21 with an echocardiogram and our CHF clinic but would like to stop these medications sooner and see if the MD may know which one is causing her itching. Will forward to MD to review for further input.  Ave Filter

## 2014-11-12 ENCOUNTER — Ambulatory Visit (HOSPITAL_COMMUNITY)
Admission: RE | Admit: 2014-11-12 | Discharge: 2014-11-12 | Disposition: A | Discharge status: Home / Self Care | Payer: 59 | Source: Ambulatory Visit | Attending: Internal Medicine | Admitting: Internal Medicine

## 2014-11-12 ENCOUNTER — Ambulatory Visit (HOSPITAL_BASED_OUTPATIENT_CLINIC_OR_DEPARTMENT_OTHER)
Admission: RE | Admit: 2014-11-12 | Discharge: 2014-11-12 | Disposition: A | Discharge status: Home / Self Care | Payer: 59 | Source: Ambulatory Visit | Attending: Internal Medicine | Admitting: Internal Medicine

## 2014-11-12 VITALS — BP 112/58 | HR 54 | Wt 189.5 lb

## 2014-11-12 DIAGNOSIS — I5020 Unspecified systolic (congestive) heart failure: Secondary | ICD-10-CM | POA: Diagnosis not present

## 2014-11-12 DIAGNOSIS — I517 Cardiomegaly: Secondary | ICD-10-CM | POA: Diagnosis not present

## 2014-11-12 DIAGNOSIS — I5022 Chronic systolic (congestive) heart failure: Secondary | ICD-10-CM | POA: Diagnosis not present

## 2014-11-12 DIAGNOSIS — I509 Heart failure, unspecified: Secondary | ICD-10-CM | POA: Diagnosis present

## 2014-11-12 MED ORDER — LOSARTAN POTASSIUM 25 MG PO TABS: 25.0000 mg | ORAL_TABLET | Freq: Every day | ORAL | Status: DC

## 2014-11-12 MED ORDER — BISOPROLOL FUMARATE 5 MG PO TABS: 2.5000 mg | ORAL_TABLET | Freq: Every day | ORAL | Status: DC

## 2014-11-12 NOTE — Addendum Note (Signed)
Encounter addended by: Theresia Bough, CMA on: 11/12/2014  1:03 PM<BR>     Documentation filed: Medications, Dx Association, Patient Instructions Section, Orders

## 2014-11-12 NOTE — Progress Notes (Signed)
Patient ID: Cassie Lawrence, female   DOB: [birth year], 29 y.o.   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  Underwent normal vaginal delivery on 06/18/14 but the next day she developed dyspnea and was found to be in CHF . An ECHO was performed and showed EF 20-25%. She was diuresed with IV lasix and and transitioned to po lasix. Overall she diuresed 10 pounds. After lengthy discussion she elected to not breast feed. She was readmitted shortly after this. HF meds adjusted including decreasing carvedilol.  Discharge weight 199 pounds.   She returns for followup. Overall feels good. Earlier this month noted itching and alopecia so she stopped all heart medicines on 7/5. Itching has resolved. Feels her heart pounding a bit. Denies dyspnea or swelling. Weight stable.  Busy at home with new born. She now has an IUD.  Working full time at [employer].  Not drinking much fluid.   ECHO 06/19/2014 EF 20-25%  ECHO 11/12/2014 Preliminary EF 40-45%  Labs (2/16): K 3.4 Creatinine 0.89  Labs (3/16): K 4.1, creatinine 0.91, BNP 332 Labs (08/21/2014) : K 3.6 Creatinine 0.69  ROS: All systems negative except as listed in HPI, PMH and Problem List.  SH:  History   Social History  . Marital Status: Single    Spouse Name: N/A  . Number of Children: N/A  . Years of Education: N/A   Occupational History  . Not on file.   Social History Main Topics  . Smoking status: Never Smoker   . Smokeless tobacco: Never Used  . Alcohol Use: No  . Drug Use: No  . Sexual Activity: Not on file   Other Topics Concern  . Not on file   Social History Narrative    FH:  Family History  Problem Relation Age of Onset  . Hypertension Mother   . Heart disease Maternal Grandmother   . Asthma Maternal Grandmother   . COPD Maternal Grandmother   . Diabetes Maternal Grandmother   . Diabetes Maternal Grandfather     Past  Medical History  Diagnosis Date  . H/O chlamydia infection   . History of gonorrhea   . Trichomonas infection   . H/O candidiasis   . H/O varicella   . No pertinent past medical history     Current Outpatient Prescriptions  Medication Sig Dispense Refill  . Prenatal Vit-Fe Fumarate-FA (PRENATAL MULTIVITAMIN) TABS Take 1 tablet by mouth daily.    . carvedilol (COREG) 6.25 MG tablet Take 1.5 tablets (9.375 mg total) by mouth 2 (two) times daily. (Patient not taking: Reported on 11/12/2014) 180 tablet 3  . furosemide (LASIX) 20 MG tablet Take 2 tablets (40 mg total) by mouth daily as needed. Weight gain 3 lb or more overnight. (Patient not taking: Reported on 11/12/2014) 20 tablet 3  . lisinopril (PRINIVIL,ZESTRIL) 10 MG tablet Take 1 tablet (10 mg total) by mouth at bedtime. (Patient not taking: Reported on 11/12/2014) 30 tablet 3  . potassium chloride SA (K-DUR,KLOR-CON) 20 MEQ tablet Take 1 tablet (20 mEq total) by mouth daily as needed. (Patient not taking: Reported on 11/12/2014) 15 tablet 3  . spironolactone (ALDACTONE) 25 MG tablet Take 1 tablet (25 mg total) by mouth daily. (Patient not taking: Reported on 11/12/2014) 30 tablet 6   No current facility-administered medications for this encounter.    Filed Vitals:   11/12/14 1214  BP: 112/58  Pulse: 54  Weight: 189 lb 8 oz (85.957 kg)  SpO2: 100%    PHYSICAL EXAM: General:  Well appearing. No resp difficulty HEENT: normal Neck: supple. JVP flat. Carotids 2+ bilaterally; no bruits. No lymphadenopathy or thryomegaly appreciated. Cor: PMI normal. Regular rate & rhythm. No rubs, gallops or murmurs. Lungs: clear Abdomen: soft, nontender, nondistended. No hepatosplenomegaly. No bruits or masses. Good bowel sounds. Extremities: no cyanosis, clubbing, rash, edema Neuro: alert & orientedx3, cranial nerves grossly intact. Moves all 4 extremities w/o difficulty. Affect pleasant.  ASSESSMENT & PLAN: Chronic Systolic HF: Peri-partum  cardiomyopathy. ECHO 20-25% on 2/16 echo.  Echo today with EF 40-45% NYHA I.  Volume status stable. - Continue Lasix as needed. Has stopped medications due to potential side effects. I think we need to restart as EF is much better but not completely back to normal.  - Will start losartan 25 daily and bisoprolol 2.5 daily and see how she tolerates  - Repeat echo at 3 months  Follow up in 4-6 weeks.   Arvilla Meres MD  11/12/2014

## 2014-11-12 NOTE — Patient Instructions (Signed)
START Bisoprolol 2.5 mg one tab daily START Losartan 2 5mg , one tab daily  Your physician recommends that you schedule a follow-up appointment in: 6 weeks

## 2014-11-12 NOTE — Progress Notes (Signed)
  Echocardiogram 2D Echocardiogram has been performed.  Cassie Lawrence 11/12/2014, 12:05 PM

## 2014-12-23 ENCOUNTER — Encounter (HOSPITAL_COMMUNITY): Payer: 59

## 2015-01-05 ENCOUNTER — Ambulatory Visit (HOSPITAL_COMMUNITY)
Admission: RE | Admit: 2015-01-05 | Discharge: 2015-01-05 | Disposition: A | Discharge status: Home / Self Care | Payer: 59 | Source: Ambulatory Visit | Attending: Internal Medicine | Admitting: Internal Medicine

## 2015-01-05 VITALS — BP 134/88 | HR 70 | Wt 199.6 lb

## 2015-01-05 DIAGNOSIS — O903 Peripartum cardiomyopathy: Secondary | ICD-10-CM

## 2015-01-05 DIAGNOSIS — I5022 Chronic systolic (congestive) heart failure: Secondary | ICD-10-CM | POA: Diagnosis present

## 2015-01-05 NOTE — Patient Instructions (Signed)
FOLLOW UP: 6 weeks w/ ECHO (Bensimhon)

## 2015-01-05 NOTE — Progress Notes (Signed)
Patient ID: Cassie Lawrence, female   DOB: Oct 13, 1985, 29 y.o.   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  Underwent normal vaginal delivery on 06/18/14 but the next day she developed dyspnea and was found to be in CHF . An ECHO was performed and showed EF 20-25%. She was diuresed with IV lasix and and transitioned to po lasix. Overall she diuresed 10 pounds. After lengthy discussion she elected to not breast feed. She was readmitted shortly after this. HF meds adjusted including decreasing carvedilol.  Discharge weight 199 pounds.   She returns for followup. Overall feels good. Only taking losartan and bisoprolol about 3 days a week. Trying walk some but working.   Busy at home with new born. She now has an IUD.  Working full time at [employer].  Not drinking much fluid.   ECHO 06/19/2014 EF 20-25%  ECHO 11/12/2014 Preliminary EF 40-45%  Labs (2/16): K 3.4 Creatinine 0.89  Labs (3/16): K 4.1, creatinine 0.91, BNP 332 Labs (08/21/2014) : K 3.6 Creatinine 0.69  ROS: All systems negative except as listed in HPI, PMH and Problem List.  SH:  Social History   Social History  . Marital Status: Single    Spouse Name: N/A  . Number of Children: N/A  . Years of Education: N/A   Occupational History  . Not on file.   Social History Main Topics  . Smoking status: Never Smoker   . Smokeless tobacco: Never Used  . Alcohol Use: No  . Drug Use: No  . Sexual Activity: Not on file   Other Topics Concern  . Not on file   Social History Narrative    FH:  Family History  Problem Relation Age of Onset  . Hypertension Mother   . Heart disease Maternal Grandmother   . Asthma Maternal Grandmother   . COPD Maternal Grandmother   . Diabetes Maternal Grandmother   . Diabetes Maternal Grandfather     Past Medical History  Diagnosis Date  . H/O chlamydia infection   . History of gonorrhea   .  Trichomonas infection   . H/O candidiasis   . H/O varicella   . No pertinent past medical history     Current Outpatient Prescriptions  Medication Sig Dispense Refill  . bisoprolol (ZEBETA) 5 MG tablet Take 0.5 tablets (2.5 mg total) by mouth daily. 15 tablet 3  . losartan (COZAAR) 25 MG tablet Take 1 tablet (25 mg total) by mouth daily. 90 tablet 3  . Omega-3 Fatty Acids (FISH OIL) 1000 MG CAPS Take 1 capsule by mouth daily.    . Prenatal Vit-Fe Fumarate-FA (PRENATAL MULTIVITAMIN) TABS Take 1 tablet by mouth daily.     No current facility-administered medications for this encounter.    Filed Vitals:   01/05/15 1218  BP: 134/88  Pulse: 70  Weight: 199 lb 9.6 oz (90.538 kg)  SpO2: 98%    PHYSICAL EXAM: General:  Well appearing. No resp difficulty HEENT: normal Neck: supple. JVP flat. Carotids 2+ bilaterally; no bruits. No lymphadenopathy or thryomegaly appreciated. Cor: PMI normal. Regular rate & rhythm. No rubs, gallops or murmurs. Lungs: clear Abdomen: soft, nontender, nondistended. No hepatosplenomegaly. No bruits or masses. Good bowel sounds. Extremities: no cyanosis, clubbing, rash, edema Neuro: alert & orientedx3, cranial nerves grossly intact. Moves all 4 extremities w/o difficulty. Affect pleasant.  ASSESSMENT & PLAN: Chronic Systolic HF: Peri-partum  cardiomyopathy. ECHO 20-25% on 2/16 echo.  Echo on 7/21 improved to EF 40-45%  NYHA I.  Volume status stable. - Continue Lasix as needed. - I have asked her to take  losartan 25 daily and bisoprolol 2.5 daily and see how she tolerates .  - Repeat echo at next appointment.   Follow up in 4-6 weeks.   Marcelia Petersen NP-C  01/05/2015

## 2015-01-05 NOTE — Progress Notes (Signed)
Advanced Heart Failure Medication Review by a Pharmacist  Does the patient  feel that his/her medications are working for him/her?  yes  Has the patient been experiencing any side effects to the medications prescribed?  yes  Does the patient measure his/her own blood pressure or blood glucose at home?  no   Does the patient have any problems obtaining medications due to transportation or finances?   no  Understanding of regimen: fair Understanding of indications: fair Potential of compliance: poor    Pharmacist comments:  Ms. Rasmusson is a pleasant 29 yo F presenting without a medication list but able to verbalize each of her medications to me. She admits to very poor compliance with her medications stating that she takes them about 3-4 times per week. She reports feeling her heart "pounding" when she takes both the losartan and bisoprolol at the same time so she has separated the timing of the two and has not felt the pounding when she does this. She states that she knows the importance of taking her medications as prescribed and will try to do better. She does not measure her BP or HR at home which may be helpful especially when she feels the "pounding" heart beat. She did not have any other medication-related questions or concerns at this time.   Tyler Deis. Bonnye Fava, PharmD, BCPS, CPP Clinical Pharmacist Pager: [phone] Phone: [phone] 01/05/2015 12:29 PM

## 2015-02-16 ENCOUNTER — Ambulatory Visit (HOSPITAL_COMMUNITY)
Admission: RE | Admit: 2015-02-16 | Discharge: 2015-02-16 | Disposition: A | Discharge status: Home / Self Care | Payer: 59 | Source: Ambulatory Visit | Attending: Obstetrics and Gynecology | Admitting: Obstetrics and Gynecology

## 2015-02-16 ENCOUNTER — Ambulatory Visit (HOSPITAL_BASED_OUTPATIENT_CLINIC_OR_DEPARTMENT_OTHER)
Admission: RE | Admit: 2015-02-16 | Discharge: 2015-02-16 | Disposition: A | Discharge status: Home / Self Care | Payer: 59 | Source: Ambulatory Visit | Attending: Internal Medicine | Admitting: Internal Medicine

## 2015-02-16 VITALS — BP 96/68 | HR 57 | Wt 197.0 lb

## 2015-02-16 DIAGNOSIS — I509 Heart failure, unspecified: Secondary | ICD-10-CM | POA: Insufficient documentation

## 2015-02-16 DIAGNOSIS — I5022 Chronic systolic (congestive) heart failure: Secondary | ICD-10-CM | POA: Diagnosis not present

## 2015-02-16 DIAGNOSIS — I340 Nonrheumatic mitral (valve) insufficiency: Secondary | ICD-10-CM | POA: Insufficient documentation

## 2015-02-16 DIAGNOSIS — I517 Cardiomegaly: Secondary | ICD-10-CM | POA: Diagnosis not present

## 2015-02-16 DIAGNOSIS — O903 Peripartum cardiomyopathy: Secondary | ICD-10-CM | POA: Diagnosis not present

## 2015-02-16 NOTE — Patient Instructions (Signed)
Follow up in 6-8 weeks.   Do the following things EVERYDAY: 1) Weigh yourself in the morning before breakfast. Write it down and keep it in a log. 2) Take your medicines as prescribed 3) Eat low salt foods-Limit salt (sodium) to 2000 mg per day.  4) Stay as active as you can everyday 5) Limit all fluids for the day to less than 2 liters

## 2015-02-16 NOTE — Progress Notes (Signed)
Patient ID: Cassie Lawrence, female   DOB: 01/15/1986, 29 y.o.   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  Underwent normal vaginal delivery on 06/18/14 but the next day she developed dyspnea and was found to be in CHF . An ECHO was performed and showed EF 20-25%. She was diuresed with IV lasix and and transitioned to po lasix. Overall she diuresed 10 pounds. After lengthy discussion she elected to not breast feed. She was readmitted shortly after this. HF meds adjusted including decreasing carvedilol.  Discharge weight 199 pounds.   She returns for follow up. Last visit she was asked to restart losartan 25 mg daily + bisoprolol daily. Taking meds 5 days a week. Just started working nights with [employer]. Working full time with [employer]. Denies SOB/PND/Orthopnea.  She now has an IUD.  Not drinking much fluid.   ECHO 06/19/2014 EF 20-25%  ECHO 11/12/2014 EF 45-50%   Labs (2/16): K 3.4 Creatinine 0.89  Labs (3/16): K 4.1, creatinine 0.91, BNP 332 Labs (08/21/2014) : K 3.6 Creatinine 0.69  ROS: All systems negative except as listed in HPI, PMH and Problem List.  SH:  Social History   Social History  . Marital Status: Single    Spouse Name: N/A  . Number of Children: N/A  . Years of Education: N/A   Occupational History  . Not on file.   Social History Main Topics  . Smoking status: Never Smoker   . Smokeless tobacco: Never Used  . Alcohol Use: No  . Drug Use: No  . Sexual Activity: Not on file   Other Topics Concern  . Not on file   Social History Narrative    FH:  Family History  Problem Relation Age of Onset  . Hypertension Mother   . Heart disease Maternal Grandmother   . Asthma Maternal Grandmother   . COPD Maternal Grandmother   . Diabetes Maternal Grandmother   . Diabetes Maternal Grandfather     Past Medical History  Diagnosis Date  . H/O chlamydia infection   .  History of gonorrhea   . Trichomonas infection   . H/O candidiasis   . H/O varicella   . No pertinent past medical history     Current Outpatient Prescriptions  Medication Sig Dispense Refill  . bisoprolol (ZEBETA) 5 MG tablet Take 0.5 tablets (2.5 mg total) by mouth daily. 15 tablet 3  . losartan (COZAAR) 25 MG tablet Take 1 tablet (25 mg total) by mouth daily. 90 tablet 3   No current facility-administered medications for this encounter.    Filed Vitals:   02/16/15 1409  BP: 96/68  Pulse: 57  Weight: 197 lb (89.359 kg)  SpO2: 100%    PHYSICAL EXAM: General:  Well appearing. No resp difficulty HEENT: normal Neck: supple. JVP flat. Carotids 2+ bilaterally; no bruits. No lymphadenopathy or thryomegaly appreciated. Cor: PMI normal. Regular rate & rhythm. No rubs, gallops or murmurs. Lungs: clear Abdomen: soft, nontender, nondistended. No hepatosplenomegaly. No bruits or masses. Good bowel sounds. Extremities: no cyanosis, clubbing, rash, edema Neuro: alert & orientedx3, cranial nerves grossly intact. Moves all 4 extremities w/o difficulty. Affect pleasant.  ASSESSMENT & PLAN: 1) Chronic Systolic HF: Peri-partum cardiomyopathy. Initially EF 20-25%. In July 45-50%.  -ECHO today. Discussed and reviewed by Dr Gala Romney. EF ~40%  - NYHA I.  Volume status stable.  Continue Lasix as needed. -  Continue  losartan 25 daily and bisoprolol 2.5 daily.  -Not taking spiro due to hair loss.  -On taking meds 5 days a week and today we encouraged her to take meds daily.   -Follow up in 6 weeks.   Amy Clegg NP-C  02/16/2015  Patient seen and examined with Tonye Becket, NP. We discussed all aspects of the encounter. I agree with the assessment and plan as stated above.   I have reviewed echo images personally. EF ~40% - down slightly from previous, Symptomatically doing well but has been noncompliant with meds and now EF down slightly again on echo. Stressed need for better compliance with  meds.Also counseled on need to avoid subsequent pregnancies due to high-risk of worsening HF.   Bensimhon, Daniel,MD 9:16 PM

## 2015-02-16 NOTE — Progress Notes (Signed)
  Echocardiogram 2D Echocardiogram has been performed.  Cassie Lawrence 02/16/2015, 2:09 PM

## 2015-03-31 ENCOUNTER — Ambulatory Visit (HOSPITAL_COMMUNITY)
Admission: RE | Admit: 2015-03-31 | Discharge: 2015-03-31 | Disposition: A | Discharge status: Home / Self Care | Payer: 59 | Source: Ambulatory Visit | Attending: Internal Medicine | Admitting: Internal Medicine

## 2015-03-31 ENCOUNTER — Encounter (HOSPITAL_COMMUNITY): Payer: Self-pay | Admitting: Internal Medicine

## 2015-03-31 VITALS — BP 112/74 | HR 63 | Wt 203.2 lb

## 2015-03-31 DIAGNOSIS — I5022 Chronic systolic (congestive) heart failure: Secondary | ICD-10-CM

## 2015-03-31 DIAGNOSIS — R001 Bradycardia, unspecified: Secondary | ICD-10-CM | POA: Diagnosis not present

## 2015-03-31 LAB — BASIC METABOLIC PANEL
Anion gap: 8 (ref 5–15)
BUN: 8 mg/dL (ref 6–20)
CHLORIDE: 106 mmol/L (ref 101–111)
CO2: 25 mmol/L (ref 22–32)
Calcium: 9.3 mg/dL (ref 8.9–10.3)
Creatinine, Ser: 0.67 mg/dL (ref 0.44–1.00)
GFR calc Af Amer: >60 mL/min (ref >60)
GFR calc non Af Amer: >60 mL/min (ref >60)
Glucose, Bld: 93 mg/dL (ref 65–99)
POTASSIUM: 3.8 mmol/L (ref 3.5–5.1)
Sodium: 139 mmol/L (ref 135–145)

## 2015-03-31 MED ORDER — LOSARTAN POTASSIUM 50 MG PO TABS: 50.0000 mg | ORAL_TABLET | Freq: Every day | ORAL | Status: DC

## 2015-03-31 NOTE — Progress Notes (Signed)
ADVANCED HF CLINIC NOTE  Patient ID: Cassie Lawrence, female   DOB: [birth year], 29 y.o.   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  Underwent normal vaginal delivery on 06/18/14 but the next day she developed dyspnea and was found to be in CHF . An ECHO was performed and showed EF 20-25%.   She returns for follow up. At last visit stopped all meds but encouraged to restart. Overall feeling ok. Denies SOB/PND/Orthopnea. Works at night but not exercising much. Taking all medications. Not requiring lasix. She now has an IUD.  Not drinking much fluid. Stopped spiro due to hair loss.   ECHO 06/19/2014 EF 20-25%  ECHO 11/12/2014 EF 45-50% ECHO 02/16/2015 EF 35-40%.     Labs (2/16): K 3.4 Creatinine 0.89  Labs (3/16): K 4.1, creatinine 0.91, BNP 332 Labs (08/21/2014) : K 3.6 Creatinine 0.69  ROS: All systems negative except as listed in HPI, PMH and Problem List.  SH:  Social History   Social History  . Marital Status: Single    Spouse Name: N/A  . Number of Children: N/A  . Years of Education: N/A   Occupational History  . Not on file.   Social History Main Topics  . Smoking status: Never Smoker   . Smokeless tobacco: Never Used  . Alcohol Use: No  . Drug Use: No  . Sexual Activity: Not on file   Other Topics Concern  . Not on file   Social History Narrative    FH:  Family History  Problem Relation Age of Onset  . Hypertension Mother   . Heart disease Maternal Grandmother   . Asthma Maternal Grandmother   . COPD Maternal Grandmother   . Diabetes Maternal Grandmother   . Diabetes Maternal Grandfather     Past Medical History  Diagnosis Date  . H/O chlamydia infection   . History of gonorrhea   . Trichomonas infection   . H/O candidiasis   . H/O varicella   . No pertinent past medical history     Current Outpatient Prescriptions  Medication Sig  Dispense Refill  . bisoprolol (ZEBETA) 5 MG tablet Take 0.5 tablets (2.5 mg total) by mouth daily. 15 tablet 3  . losartan (COZAAR) 25 MG tablet Take 1 tablet (25 mg total) by mouth daily. 90 tablet 3   No current facility-administered medications for this encounter.    Filed Vitals:   03/31/15 0856  BP: 112/74  Pulse: 63  Weight: 203 lb 4 oz (92.194 kg)  SpO2: 99%    PHYSICAL EXAM: General:  Well appearing. No resp difficulty.   HEENT: normal Neck: supple. JVP flat. Carotids 2+ bilaterally; no bruits. No lymphadenopathy or thryomegaly appreciated. Cor: PMI normal. Regular rate & rhythm. No rubs, gallops or murmurs. Lungs: clear Abdomen: soft, obese, nontender, nondistended. No hepatosplenomegaly. No bruits or masses. Good bowel sounds. Extremities: no cyanosis, clubbing, rash, edema Neuro: alert & orientedx3, cranial nerves grossly intact. Moves all 4 extremities w/o difficulty. Affect pleasant.  EKG: Sinus Bradycardia 45 bpm. PR 238 Cassie  ASSESSMENT & PLAN: 1) Chronic Systolic HF: Peri-partum cardiomyopathy. Initially EF 20-25%. In July 45-50%. -ECHO 01/2015 EF 35-40%  Has IUD to prevent pregnancy.  - NYHA I.  Volume status stable.  Continue Lasix as needed. - Increase losartan 50 mg daily  + bisoprolol 2.5 daily.  -Not taking spiro due to hair loss.  -  Now taking meds daily.  .-Follow up 2 months    Cassie Clegg NP-C  03/31/2015   Patient seen and examined with Cassie Becket, NP. We discussed all aspects of the encounter. I agree with the assessment and plan as stated above.   Echo images reviewed personally. EF ~40%. Now back on meds (except spiro). Feeling much better. Volume status stable. HR low. Will increase losartan. Continue bisoprolol 2.5 for now but may have to decrease soon due to bradycardia. Conttinue IUD> We discussed the fact that if she decides to get pregnant again there is risk of recurrence and she would need pre-stratification with dobutamine echo.   Cassie Lawrence,  Cassie Stetzer,MD 10:29 PM

## 2015-03-31 NOTE — Patient Instructions (Signed)
Routine lab work today. Will notify you of abnormal results, otherwise no news is good news!  INCREASE Losartan to 50mg  once daily. Pharmacy: [facility] - [place], [place] - [address] [Patient Preferred] [phone]  Follow up four routine appointment in 2 months.  Do the following things EVERYDAY: 1) Weigh yourself in the morning before breakfast. Write it down and keep it in a log. 2) Take your medicines as prescribed 3) Eat low salt foods-Limit salt (sodium) to 2000 mg per day.  4) Stay as active as you can everyday       5)   Limit all fluids for the day to less than 2 liters

## 2015-04-14 ENCOUNTER — Other Ambulatory Visit (HOSPITAL_COMMUNITY): Payer: Self-pay | Admitting: Internal Medicine

## 2015-04-30 MED FILL — BISOPROLOL FUMARATE 5 MG TA: 5 | 30 days supply | Qty: 15 | Fill #0

## 2015-06-01 MED FILL — BISOPROLOL FUMARATE 5 MG TA: 5 | 30 days supply | Qty: 15 | Fill #1

## 2015-06-01 MED FILL — LOSARTAN POTASSIUM 50 MG TA: 50 | 30 days supply | Qty: 30 | Fill #1

## 2015-07-12 MED FILL — BISOPROLOL FUMARATE 5 MG TA: 5 | 30 days supply | Qty: 15 | Fill #2

## 2015-07-12 MED FILL — LOSARTAN POTASSIUM 50 MG TA: 50 | 30 days supply | Qty: 30 | Fill #2

## 2016-04-03 ENCOUNTER — Ambulatory Visit (HOSPITAL_COMMUNITY)
Admission: RE | Admit: 2016-04-03 | Discharge: 2016-04-03 | Disposition: A | Discharge status: Home / Self Care | Payer: 59 | Source: Ambulatory Visit | Attending: Cardiology | Admitting: Cardiology

## 2016-04-03 ENCOUNTER — Encounter (HOSPITAL_COMMUNITY): Payer: Self-pay

## 2016-04-03 VITALS — BP 118/86 | HR 76 | Wt 200.8 lb

## 2016-04-03 DIAGNOSIS — I429 Cardiomyopathy, unspecified: Secondary | ICD-10-CM | POA: Insufficient documentation

## 2016-04-03 DIAGNOSIS — I5022 Chronic systolic (congestive) heart failure: Secondary | ICD-10-CM

## 2016-04-03 LAB — BASIC METABOLIC PANEL
Anion gap: 7 (ref 5–15)
BUN: 8 mg/dL (ref 6–20)
CHLORIDE: 103 mmol/L (ref 101–111)
CO2: 29 mmol/L (ref 22–32)
CREATININE: 0.7 mg/dL (ref 0.44–1.00)
Calcium: 9.3 mg/dL (ref 8.9–10.3)
GFR calc non Af Amer: >60 mL/min (ref >60)
Glucose, Bld: 92 mg/dL (ref 65–99)
Potassium: 3.8 mmol/L (ref 3.5–5.1)
SODIUM: 139 mmol/L (ref 135–145)

## 2016-04-03 LAB — BRAIN NATRIURETIC PEPTIDE: B NATRIURETIC PEPTIDE 5: 18.6 pg/mL (ref 0.0–100.0)

## 2016-04-03 MED ORDER — LOSARTAN POTASSIUM 25 MG PO TABS: 12.5000 mg | ORAL_TABLET | Freq: Every day | ORAL | 3 refills | Status: DC

## 2016-04-03 MED ORDER — BISOPROLOL FUMARATE 5 MG PO TABS: 2.5000 mg | ORAL_TABLET | Freq: Every day | ORAL | 3 refills | Status: DC

## 2016-04-03 MED FILL — LOSARTAN POTASSIUM 25 MG TA: 25 | 30 days supply | Qty: 15 | Fill #0

## 2016-04-03 NOTE — Progress Notes (Signed)
Advanced Heart Failure Clinic Note   Patient ID: Cassie Lawrence, female   DOB: 12-12-1985, 30 y.o.   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  Underwent normal vaginal delivery on 06/18/14 but the next day she developed dyspnea and was found to be in CHF . An ECHO was performed and showed EF 20-25%.   She returns for regular follow up.States she stopped her medicines about 10 months ago.  States she just didn't want to take them, wanted to see how she felt off of them, and they didn't work with her schedule.  Felt like she had a bout of depression which may have added to this.  Continues to weight daily and watches for symptoms. Is exercising now, goes to gym 2-3 times a week and does Zumba, Cardio dance, and TM/Free weights.  She does notice mild increased work of breathing with steps.  Otherwise no real SOB. 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 Has a IUD for birth control.   ECHO 06/19/2014 EF 20-25%  ECHO 11/12/2014 EF 45-50% ECHO 02/16/2015 EF 35-40%.    Labs (2/16): K 3.4 Creatinine 0.89  Labs (3/16): K 4.1, creatinine 0.91, BNP 332 Labs (08/21/2014) : K 3.6 Creatinine 0.69  ROS: All systems negative except as listed in HPI, PMH and Problem List.  SH:  Social History   Social History  . Marital status: Single    Spouse name: N/A  . Number of children: N/A  . Years of education: N/A   Occupational History  . Not on file.   Social History Main Topics  . Smoking status: Never Smoker  . Smokeless tobacco: Never Used  . Alcohol use No  . Drug use: No  . Sexual activity: Not on file   Other Topics Concern  . Not on file   Social History Narrative  . No narrative on file    FH:  Family History  Problem Relation Age of Onset  . Hypertension Mother   . Heart disease Maternal Grandmother   . Asthma Maternal Grandmother   . COPD Maternal  Grandmother   . Diabetes Maternal Grandmother   . Diabetes Maternal Grandfather     Past Medical History:  Diagnosis Date  . H/O candidiasis   . H/O chlamydia infection   . H/O varicella   . History of gonorrhea   . No pertinent past medical history   . Trichomonas infection     No current outpatient prescriptions on file.   No current facility-administered medications for this encounter.     Vitals:   04/03/16 0955  BP: 118/86  Pulse: 76  SpO2: 98%  Weight: 200 lb 12.8 oz (91.1 kg)   Wt Readings from Last 3 Encounters:  04/03/16 200 lb 12.8 oz (91.1 kg)  03/31/15 203 lb 4 oz (92.2 kg)  02/16/15 197 lb (89.4 kg)     PHYSICAL EXAM: General:  WDWN. NAD.  HEENT: Normal  Neck: supple. JVP flat. Carotids 2+ bilaterally; no bruits. No thyromegaly or nodule noted.  Cor: PMI normal. RRR. No M/G/R noted.  Lungs: CTAB, normal effort.  Abdomen: soft, obese, NT, ND, no HSM. No bruits or masses. +BS  Extremities: no cyanosis, clubbing, rash. No peripheral edema.  Neuro: alert & orientedx3, cranial nerves grossly intact. Moves all 4 extremities w/o difficulty. Affect pleasant.  EKG: Sinus rhythm with 1st  degree AV block, 61 bpm.   ASSESSMENT & PLAN: 1) Chronic Systolic HF: Peri-partum cardiomyopathy. Initially EF 20-25%. In July 45-50%. -ECHO 01/2015 EF 35-40%  Has IUD to prevent pregnancy.  - NYHA I-II symptoms. Volume status not elevated on exam.  - Restart losartan 12.5 mg daily. BMET/BNP today. Repeat BMET 7-10 days with resumption of Losartan. - Will hold on bisoprolol for now.  Has had bradycardia in the past and borderline by EKG today. If EF low will attempt to restart low dose as tolerated.    - Intolerant to spiro due to hair loss.  - Needs repeat Echo soon.  2) OB - Continue IUD to prevent pregnancy. Chance of recurrent drop in EF would be highly likely.    Cassie Dollar Tillery PA-C  04/03/2016   Total time spent > 25 minutes. Over half that spent discussing  the above.

## 2016-04-03 NOTE — Patient Instructions (Addendum)
Labs today (will call for abnormal results, otherwise no news is good news)  START taking Losartan 12.5 mg (1/2 Tab) Once Daily  Labs in 10 Days  Echo has been ordered for you  Follow up in 4 weeks with PharmD  Follow up in 2 Months

## 2016-04-13 ENCOUNTER — Ambulatory Visit (HOSPITAL_COMMUNITY)
Admission: RE | Admit: 2016-04-13 | Discharge: 2016-04-13 | Disposition: A | Discharge status: Home / Self Care | Payer: 59 | Source: Ambulatory Visit | Attending: Internal Medicine | Admitting: Internal Medicine

## 2016-04-13 DIAGNOSIS — I5022 Chronic systolic (congestive) heart failure: Secondary | ICD-10-CM | POA: Insufficient documentation

## 2016-04-13 LAB — BASIC METABOLIC PANEL
ANION GAP: 5 (ref 5–15)
BUN: 10 mg/dL (ref 6–20)
CHLORIDE: 107 mmol/L (ref 101–111)
CO2: 27 mmol/L (ref 22–32)
Calcium: 9.4 mg/dL (ref 8.9–10.3)
Creatinine, Ser: 0.75 mg/dL (ref 0.44–1.00)
GFR calc non Af Amer: >60 mL/min (ref >60)
Glucose, Bld: 88 mg/dL (ref 65–99)
POTASSIUM: 4 mmol/L (ref 3.5–5.1)
SODIUM: 139 mmol/L (ref 135–145)

## 2016-04-14 ENCOUNTER — Ambulatory Visit (HOSPITAL_COMMUNITY): Admission: RE | Admit: 2016-04-14 | Payer: 59 | Source: Ambulatory Visit

## 2016-04-26 ENCOUNTER — Ambulatory Visit (HOSPITAL_COMMUNITY)
Admission: RE | Admit: 2016-04-26 | Discharge: 2016-04-26 | Disposition: A | Discharge status: Home / Self Care | Payer: 59 | Source: Ambulatory Visit | Attending: Student | Admitting: Student

## 2016-04-26 DIAGNOSIS — I5022 Chronic systolic (congestive) heart failure: Secondary | ICD-10-CM | POA: Insufficient documentation

## 2016-04-26 NOTE — Progress Notes (Signed)
  Echocardiogram 2D Echocardiogram has been performed.  Cassie Lawrence 04/26/2016, 12:06 PM

## 2016-05-03 ENCOUNTER — Telehealth (HOSPITAL_COMMUNITY): Payer: Self-pay

## 2016-05-03 NOTE — Telephone Encounter (Signed)
ECHOCARDIOGRAM COMPLETE  Order: [id number]  Status:  Final result Visible to patient:  No (Not Released) Dx:  Chronic systolic heart failure (HCC)  Notes Recorded by Chyrl Civatte, RN on 05/03/2016 at 1:42 PM EST Results reviewed with patient. Patient would like to know if she can come off of Losartan. Advised will be discussed at her upcoming apt with CHF clinical pharm Cicero Duck this coming Monday and to continue taking for now as prescribed. Aware and agreeable to plan. ------  Notes Recorded by Graciella Freer, PA-C on 04/28/2016 at 7:34 PM EST EF has normalized.    Casimiro Needle [address] [place], [place] 04/28/2016 7:34 PM

## 2016-05-08 ENCOUNTER — Ambulatory Visit (HOSPITAL_COMMUNITY)
Admission: RE | Admit: 2016-05-08 | Discharge: 2016-05-08 | Disposition: A | Discharge status: Home / Self Care | Payer: 59 | Source: Ambulatory Visit | Attending: Cardiology | Admitting: Cardiology

## 2016-05-08 DIAGNOSIS — I5022 Chronic systolic (congestive) heart failure: Secondary | ICD-10-CM | POA: Diagnosis present

## 2016-05-08 DIAGNOSIS — I428 Other cardiomyopathies: Secondary | ICD-10-CM | POA: Diagnosis not present

## 2016-05-08 NOTE — Progress Notes (Signed)
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  Underwent normal vaginal delivery on 06/18/14 but the next day she developed dyspnea and was found to be in CHF. An ECHO was performed and showed EF 20-25%.   She returns today for pharmacist-led HF medication titration. At her last HF clinic visit on 04/03/16, Mardelle Matte restarted her losartan at 12.5 mg daily. Prior to this she admitted to being off of all of her medicines for about 10 months 2/2 not wanting to take them/didn't work with her work schedule. Still going to the gym 3-4 times a week for about an hour (Zumba, cardio dance, light weights). 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 Has an IUD for birth control.   . Shortness of breath/dyspnea on exertion? no  . Orthopnea/PND? no . Edema? no . Lightheadedness/dizziness? no . Daily weights at home? Yes - 195 at home (dry weight is 200) . Blood pressure/heart rate monitoring at home? no . Following low-sodium/fluid-restricted diet? no  HF Medications: Losartan 12.5 mg PO daily  Precautions/Contraindications: Spironolactone - hair loss Bisoprolol - bradycardia   Has the patient been experiencing any side effects to the medications prescribed?  no  Does the patient have any problems obtaining medications due to transportation or finances?   no - [employer] commercial   Understanding of regimen: good Understanding of indications: good Potential of compliance: good Patient understands to avoid NSAIDs. Patient understands to avoid decongestants.    Pertinent Lab Values: . 04/13/16: Serum creatinine 0.75, BUN 10, Potassium 4.0, Sodium 139  Vital Signs: . Weight: 203.4 (dry weight: ~200 lb) . Blood pressure: 118/78 mmHg  . Heart rate: 65 bpm   Assessment: 1. Chronicsystolic CHF (Improved EF 20-25%>>50-55% on ECHO 04/26/16), due to peri-partum  CM. NYHA class Isymptoms.  - Volume status stable  -  She would like to come off of all medications but discussed potential risk of EF worsening   - Will not make any changes at this point and continue losartan 12.5 mg daily (bradycardia with bisoprolol 2.5 mg daily and hair loss with spironolactone) - Plan to discuss potential of stopping losartan with MD at next appointment - Basic disease state pathophysiology, medication indication, mechanism and side effects reviewed at length with patient and he verbalized understanding 2. OB - Continue IUD to prevent pregnancy. Chance of recurrent drop in EF would be highly likely.   Plan: 1) Medication changes: Based on clinical presentation, vital signs and recent labs will continue losartan 12.5 mg daily 2) Labs: PRN 3) Follow-up: Dr. Gala Romney on 06/05/16  Leone Haven, PharmD, BCPS PGY2 Pharmacy Resident  Tyler Deis. Bonnye Fava, PharmD, BCPS, CPP Clinical Pharmacist Pager: [phone] Phone: [phone] 05/08/2016 1:54 PM   Agree with above. Echo shows improved LV function. Would continue losartan for now. She is unable to tolerate spiro or b-blocker.   Bensimhon, Daniel,MD 8:44 PM

## 2016-05-08 NOTE — Patient Instructions (Signed)
It was great to see you todady.  Please continue to take losartan daily.  Follow up appointment with Dr. Gala Romney on June 05, 2016.

## 2016-06-05 ENCOUNTER — Encounter (HOSPITAL_COMMUNITY): Payer: Self-pay | Admitting: Internal Medicine

## 2016-06-05 ENCOUNTER — Ambulatory Visit (HOSPITAL_COMMUNITY)
Admission: RE | Admit: 2016-06-05 | Discharge: 2016-06-05 | Disposition: A | Discharge status: Home / Self Care | Payer: 59 | Source: Ambulatory Visit | Attending: Internal Medicine | Admitting: Internal Medicine

## 2016-06-05 VITALS — BP 120/74 | HR 62 | Wt 198.2 lb

## 2016-06-05 DIAGNOSIS — Z5189 Encounter for other specified aftercare: Secondary | ICD-10-CM | POA: Insufficient documentation

## 2016-06-05 DIAGNOSIS — I5022 Chronic systolic (congestive) heart failure: Secondary | ICD-10-CM

## 2016-06-05 DIAGNOSIS — I429 Cardiomyopathy, unspecified: Secondary | ICD-10-CM | POA: Diagnosis not present

## 2016-06-05 DIAGNOSIS — Z8249 Family history of ischemic heart disease and other diseases of the circulatory system: Secondary | ICD-10-CM | POA: Diagnosis not present

## 2016-06-05 MED FILL — LOSARTAN POTASSIUM 25 MG TA: 25 | 30 days supply | Qty: 15 | Fill #1

## 2016-06-05 NOTE — Progress Notes (Signed)
ADVANCED HF CLINIC NOTE  Patient ID: Cassie Lawrence, female   DOB: 1986/01/24, 31 y.o.   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  Underwent normal vaginal delivery on 06/18/14 but the next day she developed dyspnea and was found to be in CHF . An ECHO was performed and showed EF 20-25%.   She returns for follow up. Recently was seen in PharmD clinic. Echo showed EF 50-55%. Medicines unchanged. Feels good. Very active. Working out. Raising two young kids. No SOB/PND/Orthopnea. Still going to the gym 3-4 times a week for about an hour (Zumba, cardio dance, light weights). 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 Has an IUD for birth control. Stopped bisoprolol due to bradycardia - HR in 40s. Now only on losartan 12.5 daily and has been out for 2 days.   ECHO 06/19/2014 EF 20-25%  ECHO 11/12/2014 EF 45-50% ECHO 02/16/2015 EF 35-40%. ECHO 04/26/16 EF 50-55%     Labs (2/16): K 3.4 Creatinine 0.89  Labs (3/16): K 4.1, creatinine 0.91, BNP 332 Labs (08/21/2014) : K 3.6 Creatinine 0.69  ROS: All systems negative except as listed in HPI, PMH and Problem List.  SH:  Social History   Social History  . Marital status: Single    Spouse name: N/A  . Number of children: N/A  . Years of education: N/A   Occupational History  . Not on file.   Social History Main Topics  . Smoking status: Never Smoker  . Smokeless tobacco: Never Used  . Alcohol use No  . Drug use: No  . Sexual activity: Not on file   Other Topics Concern  . Not on file   Social History Narrative  . No narrative on file    FH:  Family History  Problem Relation Age of Onset  . Hypertension Mother   . Heart disease Maternal Grandmother   . Asthma Maternal Grandmother   . COPD Maternal Grandmother   . Diabetes Maternal Grandmother   . Diabetes Maternal Grandfather     Past Medical  History:  Diagnosis Date  . H/O candidiasis   . H/O chlamydia infection   . H/O varicella   . History of gonorrhea   . No pertinent past medical history   . Trichomonas infection     Current Outpatient Prescriptions  Medication Sig Dispense Refill  . losartan (COZAAR) 25 MG tablet Take 0.5 tablets (12.5 mg total) by mouth daily. (Patient not taking: Reported on 06/05/2016) 45 tablet 3   No current facility-administered medications for this encounter.     Vitals:   06/05/16 1019  BP: 120/74  Pulse: 62  SpO2: 100%  Weight: 198 lb 4 oz (89.9 kg)    PHYSICAL EXAM: General:  Well appearing. No resp difficulty.   HEENT: normal Neck: supple. JVP flat. Carotids 2+ bilaterally; no bruits. No lymphadenopathy or thryomegaly appreciated. Cor: PMI normal. RR no M/R/G Lungs: clear Abdomen: soft, obese, nontender, nondistended. No hepatosplenomegaly. No bruits or masses. Good bowel sounds. Extremities: warm. no cyanosis, clubbing, rash, edema Neuro: alert & orientedx3, cranial nerves grossly intact. Moves all 4 extremities w/o difficulty. Affect pleasant.   ASSESSMENT & PLAN: 1) Chronic Systolic HF: Peri-partum cardiomyopathy. Initially EF 20-25%. In July 45-50%. -ECHO 01/2015 EF 35-40%  ECHO 1/18 EF 50-55% Has IUD to prevent pregnancy.  - NYHA I.  Volume status  stable. EF has recovered. Unable to take b-blocker due to bradycardia. Off spiro due to hair loss. Now only on low-dose losartan. We discussed continuing versus trial of stopping. At this point I would like to continue at 12.5mg  qhs as tolerated.   -No plans for repeat pregnancy at this point. If she changes her mind she will call me beforehand to arrange dobutamine echo to assess for cardiac reserve    Ruthell Feigenbaum,MD 10:51 AM

## 2016-06-05 NOTE — Patient Instructions (Addendum)
RESTART Losartan 12.5mg  daily.  Follow up with Dr.Bensimhon in 6 months.

## 2016-06-05 NOTE — Addendum Note (Signed)
Encounter addended by: Modesta Messing, CMA on: 06/05/2016 11:05 AM<BR>    Actions taken: Medication taking status modified, Sign clinical note

## 2016-07-03 ENCOUNTER — Telehealth (HOSPITAL_COMMUNITY): Payer: Self-pay

## 2016-07-03 NOTE — Telephone Encounter (Signed)
Patient calling to get cardiac clearance letter emailed to her ([email or web address]) to workout at the gym with a personal trainer. Will forward to Dr. Gala Romney to advise.  Ave Filter, RN

## 2016-07-06 ENCOUNTER — Encounter (HOSPITAL_COMMUNITY): Payer: Self-pay | Admitting: *Deleted

## 2016-07-06 ENCOUNTER — Telehealth (HOSPITAL_COMMUNITY): Payer: Self-pay | Admitting: *Deleted

## 2016-07-06 NOTE — Telephone Encounter (Signed)
Patient called in saying she needed clearance for a personal training.  Dr. Gala Romney cleared her for personal training as tolerated with rest breaks as needed.  Letter emailed to patient as requested.

## 2016-07-07 NOTE — Telephone Encounter (Signed)
Ok by me

## 2016-07-10 ENCOUNTER — Encounter (HOSPITAL_COMMUNITY): Payer: Self-pay

## 2016-08-07 IMAGING — CT CT ANGIO CHEST
2 series · 19 of 30 positions shown · IV contrast (OMNIPAQUE)
Comparison: None.

CLINICAL DATA: Shortness of breath, tachypnea. Crackles. Recent
postpartum.

EXAM:
CT ANGIOGRAPHY CHEST WITH CONTRAST
TECHNIQUE: Multidetector CT imaging of the chest was performed using the
standard protocol during bolus administration of intravenous
contrast. Multiplanar CT image reconstructions and MIPs were
obtained to evaluate the vascular anatomy.
CONTRAST:  100mL OMNIPAQUE IOHEXOL 350 MG/ML SOLN

[Series 4: pe chest · axial · 0.68mm/px · z∈[-137,+103]mm · 14 of 142 slices shown]
[im 11/142  lung]
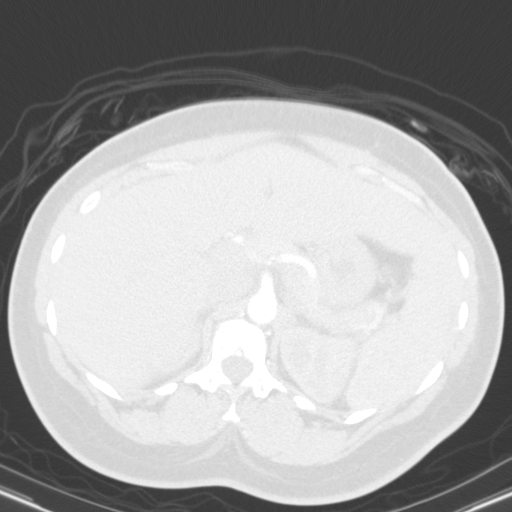
[im 21/142  mediastinal]
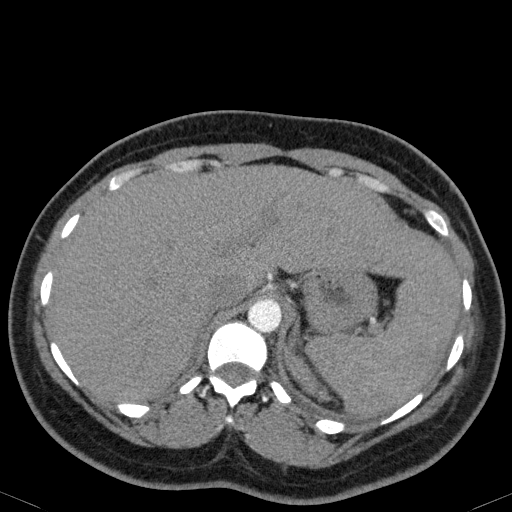
[im 31/142  lung]
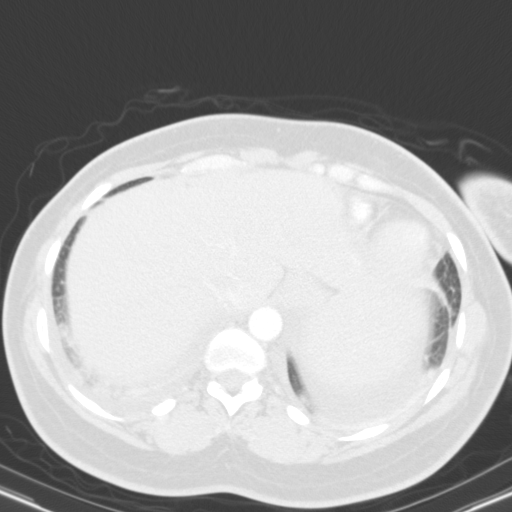
[im 41/142  mediastinal]
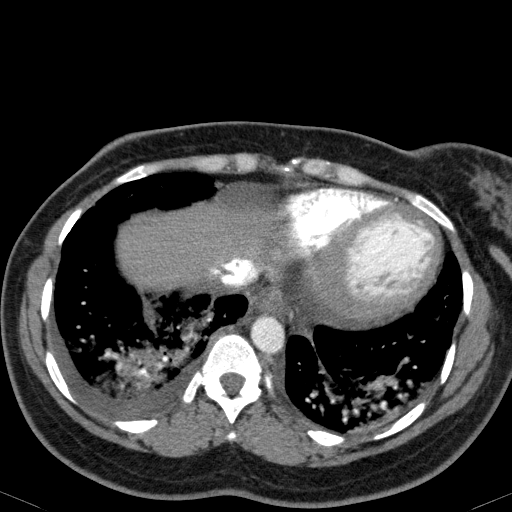
[im 51/142  lung]
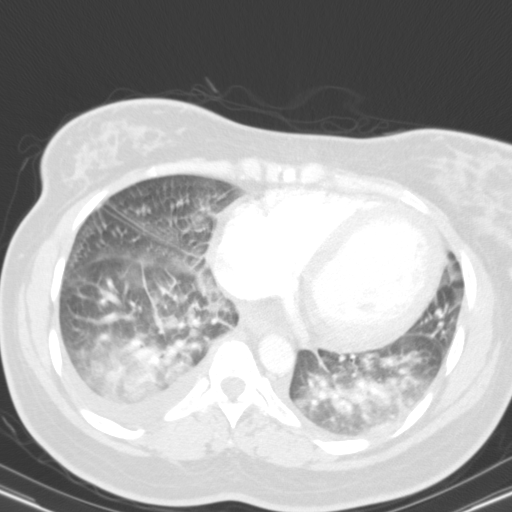
[im 61/142  mediastinal]
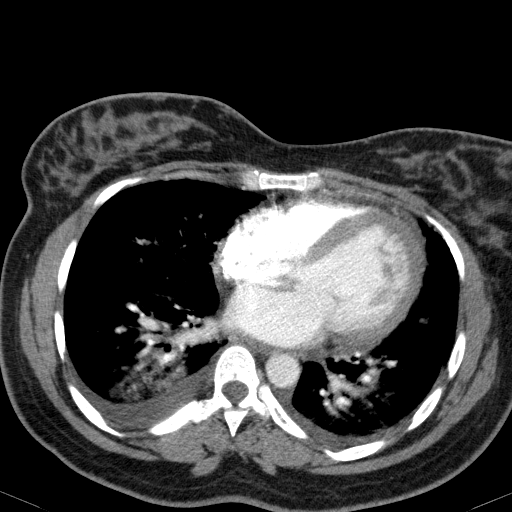
[im 67/142  lung]
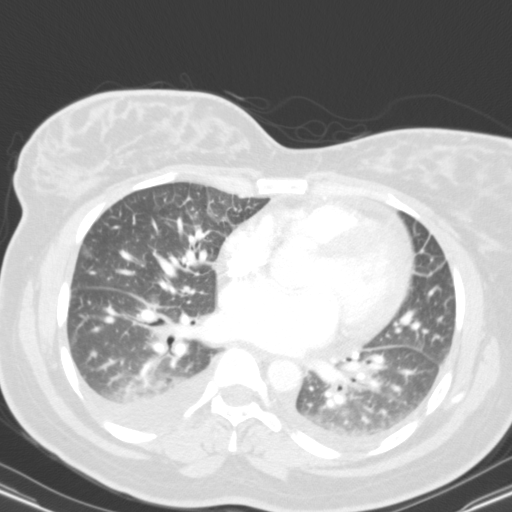
[im 71/142  mediastinal]
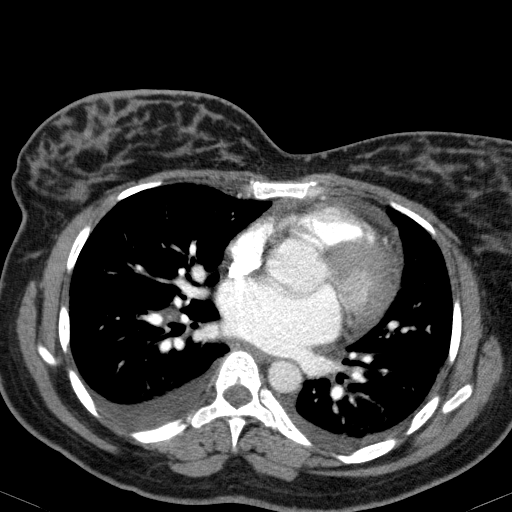
[im 81/142  lung]
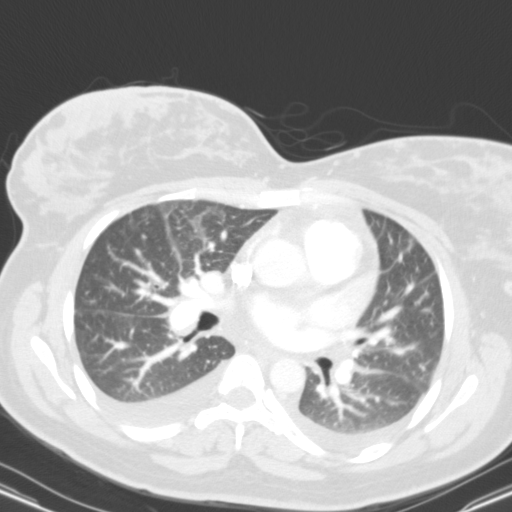
[im 91/142  mediastinal]
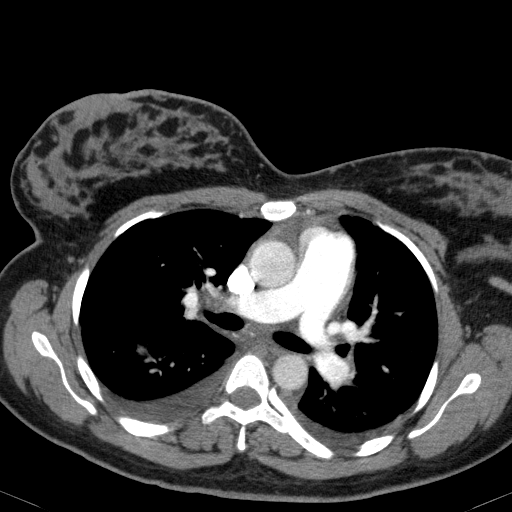
[im 101/142  lung]
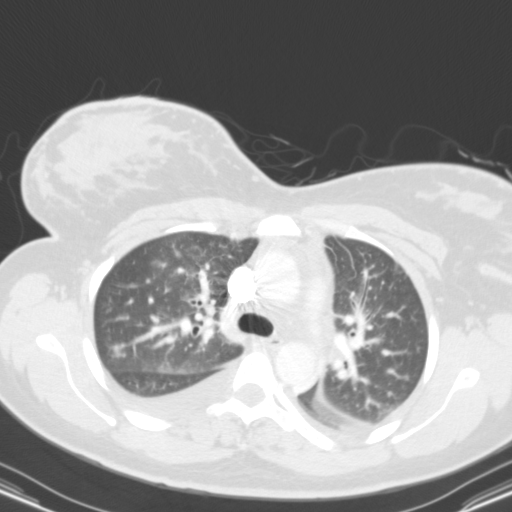
[im 111/142  mediastinal]
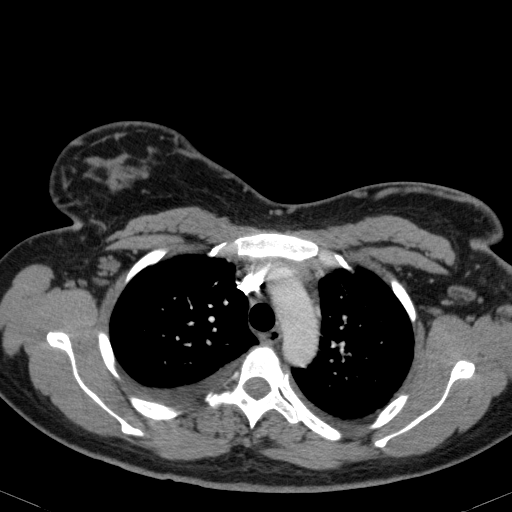
[im 121/142  lung]
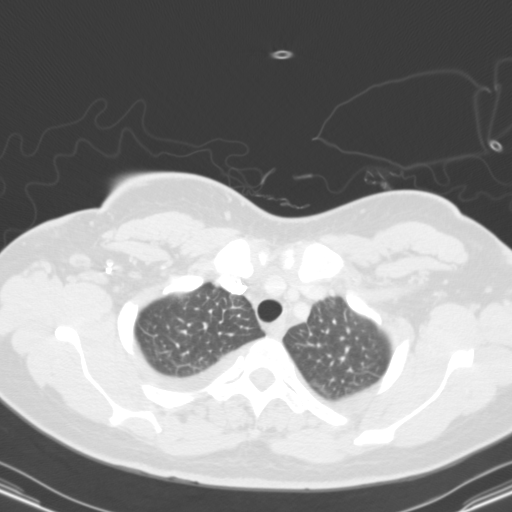
[im 131/142  mediastinal]
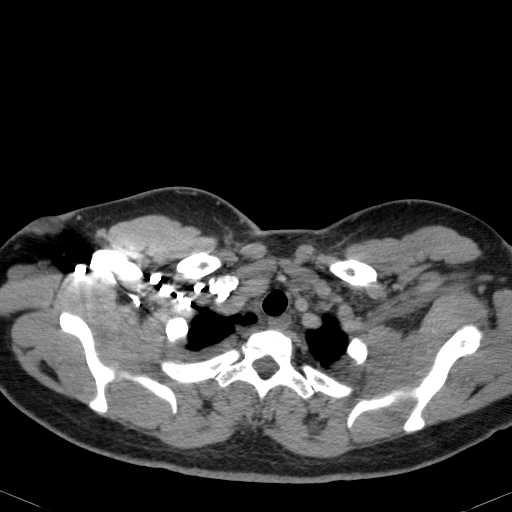

[Series 6: lung · axial · 0.68mm/px · z∈[-95,+5]mm · 5 of 121 slices shown]
[im 11/121  mediastinal]
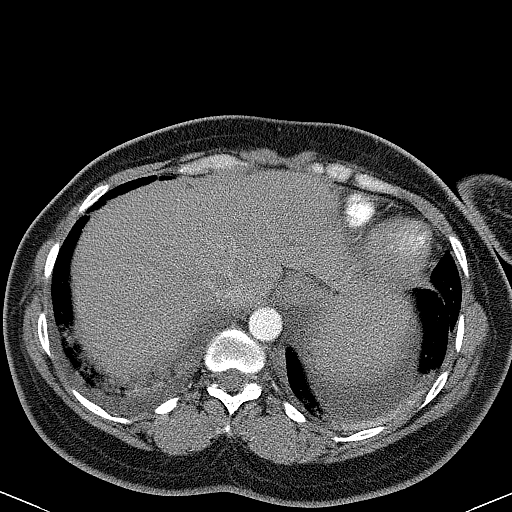
[im 31/121  mediastinal]
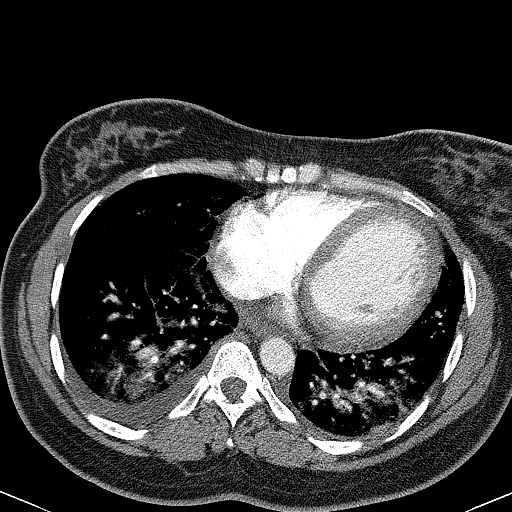
[im 41/121  mediastinal]
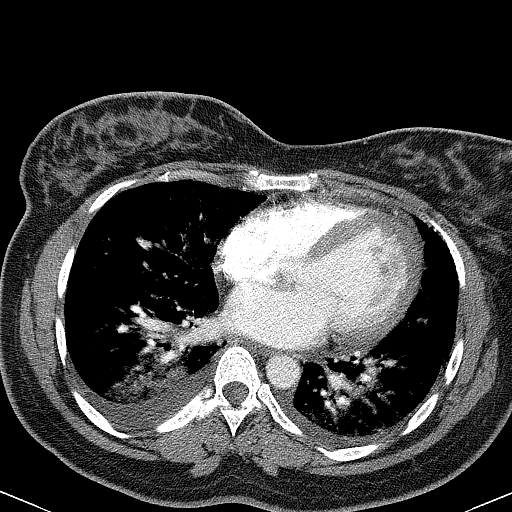
[im 51/121  mediastinal]
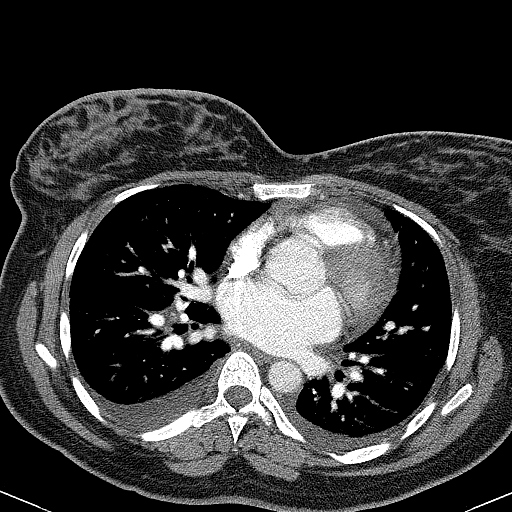
[im 61/121  mediastinal]
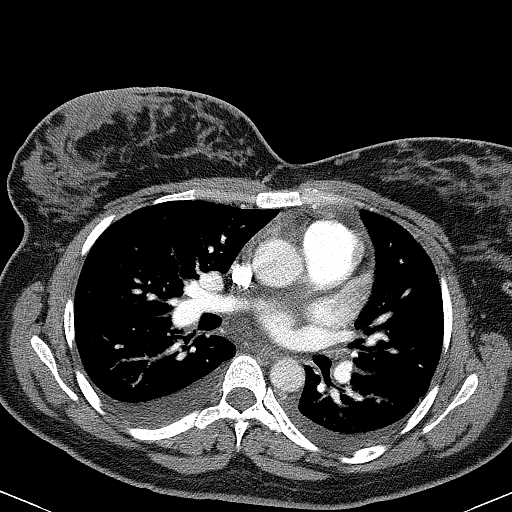

[19 of 30 positions shown; findings below may reference images not displayed]

FINDINGS: There are no filling defects within the pulmonary arteries to
suggest pulmonary embolus.

The thoracic aorta is normal in caliber. There is no evidence of
dissection. Heart at the upper limits of normal in size. Small
bilateral pleural effusions, right greater than left. No pericardial
effusion. No mediastinal or hilar adenopathy.

There multi focal bilateral fluffy and ground-glass opacities, lower
lobe and basilar predominant distribution. More confluent opacities
are seen within the dependent bilateral lower lobes. There is smooth
septal thickening. Motion artifact seen at the lung bases.

No acute abnormality in the included upper abdomen. No osseous
abnormalities.

Review of the MIP images confirms the above findings.
IMPRESSION: 1. No pulmonary embolus.
2. Small bilateral pleural effusions, right greater than left.
Findings suggesting pulmonary edema. More confluent lower lobe
opacities are likely confluent edema, less likely pneumonia or
aspiration.

## 2017-05-09 ENCOUNTER — Encounter (HOSPITAL_COMMUNITY): Payer: 59 | Admitting: Internal Medicine

## 2017-06-22 ENCOUNTER — Ambulatory Visit (HOSPITAL_COMMUNITY)
Admission: RE | Admit: 2017-06-22 | Discharge: 2017-06-22 | Disposition: A | Discharge status: Home / Self Care | Payer: 59 | Source: Ambulatory Visit | Attending: Internal Medicine | Admitting: Internal Medicine

## 2017-06-22 VITALS — BP 134/88 | HR 63 | Wt 202.5 lb

## 2017-06-22 DIAGNOSIS — O903 Peripartum cardiomyopathy: Secondary | ICD-10-CM

## 2017-06-22 DIAGNOSIS — I5022 Chronic systolic (congestive) heart failure: Secondary | ICD-10-CM | POA: Diagnosis present

## 2017-06-22 DIAGNOSIS — I429 Cardiomyopathy, unspecified: Secondary | ICD-10-CM | POA: Insufficient documentation

## 2017-06-22 DIAGNOSIS — I428 Other cardiomyopathies: Secondary | ICD-10-CM

## 2017-06-22 NOTE — Addendum Note (Signed)
Encounter addended by: Noralee Space, RN on: 06/22/2017 10:16 AM  Actions taken: Visit diagnoses modified, Order list changed, Diagnosis association updated, Sign clinical note

## 2017-06-22 NOTE — Progress Notes (Signed)
ADVANCED HF CLINIC NOTE  Patient ID: Cassie Lawrence, female   DOB: December 15, 1985, 32 y.o.   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  Underwent normal vaginal delivery on 06/18/14 but the next day she developed dyspnea and was found to be in CHF . An ECHO was performed and showed EF 20-25%.   She returns for follow up. Echo 1/18 EF 50-55%. Unable to tolerate b-blocker due to bradycardia. At last visit we decided to continue losartan 12.5mg  qhs as tolerated.  Returns today for routine f/u. Not taking losartan. Feels good. Exercising regularly. No sob, orthopnea or PND. Just had IUD removed. Husband had vasectomy.     ECHO 06/19/2014 EF 20-25%  ECHO 11/12/2014 EF 45-50% ECHO 02/16/2015 EF 35-40%. ECHO 04/26/16 EF 50-55%     Labs (2/16): K 3.4 Creatinine 0.89  Labs (3/16): K 4.1, creatinine 0.91, BNP 332 Labs (08/21/2014) : K 3.6 Creatinine 0.69  ROS: All systems negative except as listed in HPI, PMH and Problem List.  SH:  Social History   Socioeconomic History  . Marital status: Single    Spouse name: Not on file  . Number of children: Not on file  . Years of education: Not on file  . Highest education level: Not on file  Social Needs  . Financial resource strain: Not on file  . Food insecurity - worry: Not on file  . Food insecurity - inability: Not on file  . Transportation needs - medical: Not on file  . Transportation needs - non-medical: Not on file  Occupational History  . Not on file  Tobacco Use  . Smoking status: Never Smoker  . Smokeless tobacco: Never Used  Substance and Sexual Activity  . Alcohol use: No  . Drug use: No  . Sexual activity: Not on file  Other Topics Concern  . Not on file  Social History Narrative  . Not on file    FH:  Family History  Problem Relation Age of Onset  . Hypertension Mother   . Heart disease Maternal Grandmother   .  Asthma Maternal Grandmother   . COPD Maternal Grandmother   . Diabetes Maternal Grandmother   . Diabetes Maternal Grandfather     Past Medical History:  Diagnosis Date  . H/O candidiasis   . H/O chlamydia infection   . H/O varicella   . History of gonorrhea   . No pertinent past medical history   . Trichomonas infection     No current outpatient medications on file.   No current facility-administered medications for this encounter.     Vitals:   06/22/17 0953  BP: 134/88  Pulse: 63  SpO2: 96%  Weight: 202 lb 8 oz (91.9 kg)    PHYSICAL EXAM: General:  Well appearing. No resp difficulty HEENT: normal Neck: supple. no JVD. Carotids 2+ bilat; no bruits. No lymphadenopathy or thryomegaly appreciated. Cor: PMI nondisplaced. Regular rate & rhythm. No rubs, gallops or murmurs. Lungs: clear Abdomen: soft, nontender, nondistended. No hepatosplenomegaly. No bruits or masses. Good bowel sounds. Extremities: no cyanosis, clubbing, rash, edema Neuro: alert & orientedx3, cranial nerves grossly intact. moves all 4 extremities w/o difficulty. Affect pleasant   ASSESSMENT & PLAN: 1) Chronic Systolic HF: Peri-partum cardiomyopathy. Initially EF 20-25%. In July 45-50%. -ECHO 01/2015 EF 35-40%  ECHO 1/18 EF 50-55% Has IUD to prevent pregnancy.  - Remains NYHA I.  Volume status stable. EF has recovered. Unable to take b-blocker due to bradycardia. Off spiro due to hair loss. Has stopped losartan on her own - Will get echo to reassess EF - No plans for repeat pregnancy at this point. Husband had vasectomy. IUD out for weight loss reasons. I suggested she consider getting it put back in for better contraception effectiveness   Cassie Boom Zavier Canela,MD 9:59 AM

## 2017-06-22 NOTE — Patient Instructions (Signed)
Your physician has requested that you have an echocardiogram. Echocardiography is a painless test that uses sound waves to create images of your heart. It provides your doctor with information about the size and shape of your heart and how well your heart's chambers and valves are working. This procedure takes approximately one hour. There are no restrictions for this procedure.  We will contact you in 6 months to schedule your next appointment.  

## 2017-07-16 ENCOUNTER — Ambulatory Visit (HOSPITAL_COMMUNITY)
Admission: RE | Admit: 2017-07-16 | Discharge: 2017-07-16 | Disposition: A | Discharge status: Home / Self Care | Payer: 59 | Source: Ambulatory Visit | Attending: Cardiology | Admitting: Cardiology

## 2017-07-16 DIAGNOSIS — I428 Other cardiomyopathies: Secondary | ICD-10-CM | POA: Insufficient documentation

## 2017-07-16 DIAGNOSIS — I5022 Chronic systolic (congestive) heart failure: Secondary | ICD-10-CM | POA: Diagnosis not present

## 2017-07-16 NOTE — Progress Notes (Signed)
  Echocardiogram 2D Echocardiogram has been performed.  Roosvelt Maser F 07/16/2017, 11:02 AM

## 2017-08-13 ENCOUNTER — Other Ambulatory Visit: Payer: Self-pay | Admitting: Gastroenterology

## 2017-08-13 DIAGNOSIS — R7989 Other specified abnormal findings of blood chemistry: Secondary | ICD-10-CM

## 2017-08-13 DIAGNOSIS — R945 Abnormal results of liver function studies: Principal | ICD-10-CM

## 2017-08-24 ENCOUNTER — Ambulatory Visit
Admission: RE | Admit: 2017-08-24 | Discharge: 2017-08-24 | Disposition: A | Discharge status: Home / Self Care | Payer: 59 | Source: Ambulatory Visit | Attending: Gastroenterology | Admitting: Gastroenterology

## 2019-12-22 ENCOUNTER — Other Ambulatory Visit (HOSPITAL_COMMUNITY): Payer: Self-pay | Admitting: *Deleted

## 2022-08-08 ENCOUNTER — Telehealth (HOSPITAL_COMMUNITY): Payer: Self-pay | Admitting: Vascular Surgery

## 2022-08-08 NOTE — Telephone Encounter (Signed)
Called pt to moved 4/25 np appt w/ DB, pt needs to be moved due to provider being out office

## 2022-08-15 ENCOUNTER — Encounter (HOSPITAL_COMMUNITY): Payer: Self-pay | Admitting: Internal Medicine

## 2022-08-15 ENCOUNTER — Ambulatory Visit (HOSPITAL_COMMUNITY)
Admission: RE | Admit: 2022-08-15 | Discharge: 2022-08-15 | Disposition: A | Discharge status: Home / Self Care | Payer: Commercial Managed Care - PPO | Source: Ambulatory Visit | Attending: Internal Medicine | Admitting: Internal Medicine

## 2022-08-15 ENCOUNTER — Other Ambulatory Visit (HOSPITAL_COMMUNITY): Payer: Self-pay

## 2022-08-15 VITALS — BP 148/116 | HR 78 | Ht 65.0 in | Wt 222.6 lb

## 2022-08-15 DIAGNOSIS — R0683 Snoring: Secondary | ICD-10-CM | POA: Diagnosis not present

## 2022-08-15 DIAGNOSIS — I5022 Chronic systolic (congestive) heart failure: Secondary | ICD-10-CM | POA: Diagnosis not present

## 2022-08-15 DIAGNOSIS — I10 Essential (primary) hypertension: Secondary | ICD-10-CM

## 2022-08-15 DIAGNOSIS — I110 Hypertensive heart disease with heart failure: Secondary | ICD-10-CM | POA: Diagnosis not present

## 2022-08-15 DIAGNOSIS — E049 Nontoxic goiter, unspecified: Secondary | ICD-10-CM | POA: Diagnosis not present

## 2022-08-15 LAB — CBC
HCT: 37.1 % (ref 36.0–46.0)
Hemoglobin: 11.9 g/dL — ABNORMAL LOW (ref 12.0–15.0)
MCH: 27.1 pg (ref 26.0–34.0)
MCHC: 32.1 g/dL (ref 30.0–36.0)
MCV: 84.5 fL (ref 80.0–100.0)
Platelets: 290 10*3/uL (ref 150–400)
RBC: 4.39 MIL/uL (ref 3.87–5.11)
RDW: 15 % (ref 11.5–15.5)
WBC: 7.5 10*3/uL (ref 4.0–10.5)
nRBC: 0 % (ref 0.0–0.2)

## 2022-08-15 LAB — COMPREHENSIVE METABOLIC PANEL
ALT: 54 U/L — ABNORMAL HIGH (ref 0–44)
AST: 49 U/L — ABNORMAL HIGH (ref 15–41)
Albumin: 3.7 g/dL (ref 3.5–5.0)
Alkaline Phosphatase: 104 U/L (ref 38–126)
Anion gap: 7 (ref 5–15)
BUN: 12 mg/dL (ref 6–20)
CO2: 27 mmol/L (ref 22–32)
Calcium: 9.4 mg/dL (ref 8.9–10.3)
Chloride: 103 mmol/L (ref 98–111)
Creatinine, Ser: 0.77 mg/dL (ref 0.44–1.00)
GFR, Estimated: >60 mL/min (ref >60)
Glucose, Bld: 89 mg/dL (ref 70–99)
Potassium: 4.1 mmol/L (ref 3.5–5.1)
Sodium: 137 mmol/L (ref 135–145)
Total Bilirubin: 0.4 mg/dL (ref 0.3–1.2)
Total Protein: 7.8 g/dL (ref 6.5–8.1)

## 2022-08-15 LAB — BRAIN NATRIURETIC PEPTIDE: B Natriuretic Peptide: 34.1 pg/mL (ref 0.0–100.0)

## 2022-08-15 LAB — TSH: TSH: 0.841 u[IU]/mL (ref 0.350–4.500)

## 2022-08-15 MED ORDER — ENTRESTO 49-51 MG PO TABS: 1.0000 | ORAL_TABLET | Freq: Two times a day (BID) | ORAL | 6 refills | Status: DC

## 2022-08-15 NOTE — Progress Notes (Signed)
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 Who returns to re-establish HF care after a 5-year absence from Clinic.   Underwent normal vaginal delivery on 06/18/14 but the next day she developed dyspnea and was found to be in CHF. An ECHO was performed and showed EF 20-25%. We followed her for several years and EF improved to 55% despite some intolerance to GDMT agents. 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   Has not been exercising regularly since 2020. Remains mildly active at work. BP has been significantly elevated 150/102  No meds since 2020. Snores mildly. No CP, SOB, orthopnea, PND, palpitations or edema.    ECG: SR 64 with 1AVB LVH with repol Personally reviewed     ECHO 06/19/2014 EF 20-25%  ECHO 11/12/2014 EF 45-50% ECHO 02/16/2015 EF 35-40%. ECHO 04/26/16 EF 50-55%  ECHO 3/19 EF 55%    Review of Systems: [y] = yes,  = no   General: Weight gain ; Weight loss ; Anorexia ; Fatigue ; Fever ; Chills ; Weakness   Cardiac: Chest pain/pressure ; Resting SOB ; Exertional SOB ; Orthopnea ; Pedal Edema ; Palpitations ; Syncope ; Presyncope ; Paroxysmal nocturnal dyspnea[ ]   Pulmonary: Cough ; Wheezing[ ] ; Hemoptysis[ ] ; Sputum ; Snoring   GI: Vomiting[ ] ; Dysphagia[ ] ; Melena[ ] ; Hematochezia ; Heartburn[ ] ; Abdominal pain ; Constipation ; Diarrhea ; BRBPR   GU: Hematuria[ ] ; Dysuria ; Nocturia[ ]   Vascular: Pain in legs with walking ; Pain in feet with lying flat ; Non-healing sores ; Stroke ; TIA ; Slurred speech ;  Neuro: Headaches[ ] ; Vertigo[ ] ; Seizures[ ] ; Paresthesias[ ] ;Blurred vision ; Diplopia ; Vision changes    Ortho/Skin: Arthritis ; Joint pain ; Muscle pain ; Joint swelling ; Back Pain ; Rash   Psych: Depression[ ] ; Anxiety[ ]   Heme: Bleeding problems ; Clotting disorders ; Anemia   Endocrine: Diabetes ; Thyroid dysfunction[ ]    Past Medical History:  Diagnosis Date   H/O candidiasis    H/O chlamydia infection    H/O varicella    History of gonorrhea    No pertinent past medical history    Trichomonas infection     No current outpatient medications on file.   No current facility-administered medications for this encounter.    Allergies  Allergen Reactions   Penicillins Swelling      Social History   Socioeconomic History   Marital status: Single    Spouse name: Not on file   Number of children: Not on file   Years of education: Not on file   Highest education level: Not on file  Occupational History   Not on file  Tobacco Use   Smoking status: Never   Smokeless tobacco: Never  Substance and Sexual Activity   Alcohol use: No   Drug use: No   Sexual activity: Not on file  Other Topics Concern   Not on file  Social History Narrative   Not on file  Social Determinants of Health   Financial Resource Strain: Not on file  Food Insecurity: Not on file  Transportation Needs: Not on file  Physical Activity: Not on file  Stress: Not on file  Social Connections: Not on file  Intimate Partner Violence: Not on file      Family History  Problem Relation Age of Onset   Hypertension Mother    Heart disease Maternal Grandmother    Asthma Maternal Grandmother    COPD Maternal Grandmother    Diabetes Maternal Grandmother    Diabetes Maternal Grandfather     Vitals:   08/15/22 1436  BP: (!) 148/116  Pulse: 78  SpO2: 100%  Weight: 101 kg (222 lb 9.6 oz)  Height:  (1.651 m)    PHYSICAL EXAM: General:  Well appearing. No respiratory difficulty HEENT: normal Neck: supple. no JVD. Carotids 2+ bilat; no bruits. No  lymphadenopathy appreciated. +goiter Cor: PMI nondisplaced. Regular rate & rhythm. No rubs, gallops or murmurs. Lungs: clear Abdomen: obese soft, nontender, nondistended. No hepatosplenomegaly. No bruits or masses. Good bowel sounds. Extremities: no cyanosis, clubbing, rash, edema Neuro: alert & oriented x 3, cranial nerves grossly intact. moves all 4 extremities w/o difficulty. Affect pleasant.    ASSESSMENT & PLAN:  1) Chronic Systolic HF: Peri-partum cardiomyopathy.  - Initially EF 20-25% in 2016 - ECHO 06/19/2014 EF 20-25%  - ECHO 11/12/2014 EF 45-50% - ECHO 02/16/2015 EF 35-40%. - ECHO 04/26/16 EF 50-55%  - ECHO 3/19 EF 55% - POCUS echo today in clinic EF ~45%. Unclear if residual peripartum of also hypertensive - NYHA I-II - Volume status ok - In past, unable to take b-blocker due to bradycardia. Off spiro due to hair loss.  - Will get formal echo to reassess EF - Start Entresto 49/51 bid - Follow BP with BP cuff - No plans for repeat pregnancy at this point. Husband had vasectomy.  2) HTN, uncontrolled - plan as above - consider sleep study as needed  3) Goiter - check TFTs and thyroid u/s  Arvilla Meres, MD  3:21 PM

## 2022-08-15 NOTE — Patient Instructions (Signed)
Medication Changes:  Start Entresto 49/51 mg Twice daily   Lab Work:  Labs done today, your results will be available in MyChart, we will contact you for abnormal readings.  Testing/Procedures:  Your physician has requested that you have an echocardiogram. Echocardiography is a painless test that uses sound waves to create images of your heart. It provides your doctor with information about the size and shape of your heart and how well your heart's chambers and valves are working. This procedure takes approximately one hour. There are no restrictions for this procedure. Please do NOT wear cologne, perfume, aftershave, or lotions (deodorant is allowed). Please arrive 15 minutes prior to your appointment time.  Thyroid Ultrasound, once approved by insurance we will call you to schedule this  Referrals:  none  Special Instructions // Education:  Your physician has requested that you regularly monitor and record your blood pressure readings at home. Please use the same machine at the same time of day to check your readings and record them to bring to your follow-up visit.  Do the following things EVERYDAY: Weigh yourself in the morning before breakfast. Write it down and keep it in a log. Take your medicines as prescribed Eat low salt foods--Limit salt (sodium) to 2000 mg per day.  Stay as active as you can everyday Limit all fluids for the day to less than 2 liters Check your BP daily  Follow-Up in: 4 months (August), **PLEASE CALL OUR OFFICE IN JUNE TO SCHEDULE THIS APPOINTMENT  At the [facility], you and your health needs are our priority. We have a designated team specialized in the treatment of Heart Failure. This Care Team includes your primary Heart Failure Specialized Cardiologist (physician), Advanced Practice Providers (APPs- Physician Assistants and Nurse Practitioners), and Pharmacist who all work together to provide you with the care you need, when you  need it.   You may see any of the following providers on your designated Care Team at your next follow up:  Dr. Arvilla Meres Dr. Marca Ancona Dr. Marcos Eke, NP Robbie Lis, [place] [facility] [place], [place] Brynda Peon, NP Karle Plumber, PharmD   Please be sure to bring in all your medications bottles to every appointment.   Need to Contact us:  If you have any questions or concerns before your next appointment please send [place] a message through [place] or call our office at [phone].    TO LEAVE A MESSAGE FOR THE NURSE SELECT OPTION 2, PLEASE LEAVE A MESSAGE INCLUDING: YOUR NAME DATE OF BIRTH CALL BACK NUMBER REASON FOR CALL**this is important as we prioritize the call backs  YOU WILL RECEIVE A CALL BACK THE SAME DAY AS LONG AS YOU CALL BEFORE 4:00 PM

## 2022-08-16 LAB — HEMOGLOBIN A1C
Hgb A1c MFr Bld: 5.7 % — ABNORMAL HIGH (ref 4.8–5.6)
Mean Plasma Glucose: 117 mg/dL

## 2022-08-17 ENCOUNTER — Encounter (HOSPITAL_COMMUNITY): Payer: 59 | Admitting: Internal Medicine

## 2022-08-22 ENCOUNTER — Telehealth (HOSPITAL_COMMUNITY): Payer: Self-pay | Admitting: Vascular Surgery

## 2022-08-22 NOTE — Telephone Encounter (Signed)
Lvm giving pt thyroid ultrasound 5/3 1230 , asked pt to call back to confirm

## 2022-08-25 ENCOUNTER — Ambulatory Visit (HOSPITAL_COMMUNITY)
Admission: RE | Admit: 2022-08-25 | Discharge: 2022-08-25 | Disposition: A | Discharge status: Home / Self Care | Payer: Commercial Managed Care - PPO | Source: Ambulatory Visit | Attending: Internal Medicine | Admitting: Internal Medicine

## 2022-08-25 ENCOUNTER — Other Ambulatory Visit (HOSPITAL_COMMUNITY): Payer: Self-pay | Admitting: Internal Medicine

## 2022-08-25 DIAGNOSIS — I429 Cardiomyopathy, unspecified: Secondary | ICD-10-CM | POA: Diagnosis present

## 2022-08-25 DIAGNOSIS — R001 Bradycardia, unspecified: Secondary | ICD-10-CM | POA: Insufficient documentation

## 2022-08-25 DIAGNOSIS — E049 Nontoxic goiter, unspecified: Secondary | ICD-10-CM

## 2022-08-25 DIAGNOSIS — I509 Heart failure, unspecified: Secondary | ICD-10-CM | POA: Diagnosis not present

## 2022-08-25 DIAGNOSIS — I428 Other cardiomyopathies: Secondary | ICD-10-CM

## 2022-08-25 DIAGNOSIS — I5022 Chronic systolic (congestive) heart failure: Secondary | ICD-10-CM

## 2022-08-25 DIAGNOSIS — I082 Rheumatic disorders of both aortic and tricuspid valves: Secondary | ICD-10-CM | POA: Insufficient documentation

## 2022-08-25 DIAGNOSIS — I10 Essential (primary) hypertension: Secondary | ICD-10-CM

## 2022-08-25 LAB — ECHOCARDIOGRAM COMPLETE
AR max vel: 3.65 cm2
AV Area VTI: 3.54 cm2
AV Area mean vel: 3.47 cm2
AV Mean grad: 3 mmHg
AV Peak grad: 4.8 mmHg
Ao pk vel: 1.1 m/s
Area-P 1/2: 3.42 cm2
Calc EF: 52.9 %
Est EF: 50
MV VTI: 4.26 cm2
S' Lateral: 3.5 cm
Single Plane A2C EF: 51.7 %
Single Plane A4C EF: 51.1 %

## 2022-09-12 ENCOUNTER — Telehealth (HOSPITAL_COMMUNITY): Payer: Self-pay

## 2022-09-12 ENCOUNTER — Other Ambulatory Visit (HOSPITAL_COMMUNITY): Payer: Self-pay

## 2022-09-12 MED ORDER — SACUBITRIL-VALSARTAN 97-103 MG PO TABS: 1.0000 | ORAL_TABLET | Freq: Two times a day (BID) | ORAL | 6 refills | Status: DC

## 2022-09-12 NOTE — Telephone Encounter (Signed)
Patient called to report some of her BP readings that you requested.  She was having trouble with Mychart.  She is taking Entresto 49/51 twice daily.  5/1 - 124/85 5/8 - 133/103 5/12 - 139/101 5/17 - 124/96

## 2022-09-12 NOTE — Telephone Encounter (Signed)
Spoke to patient and informed about medication changed. She verbalized understanding.

## 2022-12-01 ENCOUNTER — Encounter (HOSPITAL_COMMUNITY): Payer: Self-pay | Admitting: Pharmacy Technician

## 2022-12-01 ENCOUNTER — Other Ambulatory Visit (HOSPITAL_COMMUNITY): Payer: Self-pay

## 2022-12-01 ENCOUNTER — Encounter (HOSPITAL_COMMUNITY): Payer: Self-pay

## 2022-12-01 ENCOUNTER — Ambulatory Visit (HOSPITAL_COMMUNITY)
Admission: RE | Admit: 2022-12-01 | Discharge: 2022-12-01 | Disposition: A | Discharge status: Home / Self Care | Payer: Commercial Managed Care - PPO | Source: Ambulatory Visit | Attending: Family Medicine | Admitting: Family Medicine

## 2022-12-01 VITALS — BP 110/82 | HR 71 | Wt 221.2 lb

## 2022-12-01 DIAGNOSIS — R7303 Prediabetes: Secondary | ICD-10-CM | POA: Diagnosis not present

## 2022-12-01 DIAGNOSIS — I10 Essential (primary) hypertension: Secondary | ICD-10-CM

## 2022-12-01 DIAGNOSIS — R0683 Snoring: Secondary | ICD-10-CM | POA: Diagnosis not present

## 2022-12-01 DIAGNOSIS — E049 Nontoxic goiter, unspecified: Secondary | ICD-10-CM | POA: Diagnosis not present

## 2022-12-01 DIAGNOSIS — I110 Hypertensive heart disease with heart failure: Secondary | ICD-10-CM | POA: Diagnosis present

## 2022-12-01 DIAGNOSIS — R7989 Other specified abnormal findings of blood chemistry: Secondary | ICD-10-CM

## 2022-12-01 DIAGNOSIS — I5022 Chronic systolic (congestive) heart failure: Secondary | ICD-10-CM | POA: Diagnosis not present

## 2022-12-01 LAB — COMPREHENSIVE METABOLIC PANEL
ALT: 83 U/L — ABNORMAL HIGH (ref 0–44)
AST: 68 U/L — ABNORMAL HIGH (ref 15–41)
Albumin: 3.9 g/dL (ref 3.5–5.0)
Alkaline Phosphatase: 126 U/L (ref 38–126)
Anion gap: 7 (ref 5–15)
BUN: 17 mg/dL (ref 6–20)
CO2: 26 mmol/L (ref 22–32)
Calcium: 9.3 mg/dL (ref 8.9–10.3)
Chloride: 105 mmol/L (ref 98–111)
Creatinine, Ser: 0.72 mg/dL (ref 0.44–1.00)
GFR, Estimated: >60 mL/min (ref >60)
Glucose, Bld: 98 mg/dL (ref 70–99)
Potassium: 4.5 mmol/L (ref 3.5–5.1)
Sodium: 138 mmol/L (ref 135–145)
Total Bilirubin: 0.5 mg/dL (ref 0.3–1.2)
Total Protein: 8.1 g/dL (ref 6.5–8.1)

## 2022-12-01 LAB — BRAIN NATRIURETIC PEPTIDE: B Natriuretic Peptide: 11.1 pg/mL (ref 0.0–100.0)

## 2022-12-01 MED ORDER — EMPAGLIFLOZIN 10 MG PO TABS: 10.0000 mg | ORAL_TABLET | Freq: Every day | ORAL | 6 refills | Status: DC

## 2022-12-01 NOTE — Progress Notes (Signed)
Patient Name: Cassie Lawrence        DOB: 03/15/1986      Height:     QMVHQI:696 lbs  Office Name: [facility]         Referring Provider: Prince Rome NP  Today's Date: 12/01/2022  Date:   STOP BANG RISK ASSESSMENT S (snore) Have you been told that you snore?     YES   T (tired) Are you often tired, fatigued, or sleepy during the day?   YES  O (obstruction) Do you stop breathing, choke, or gasp during sleep? NO   P (pressure) Do you have or are you being treated for high blood pressure? NO   B (BMI) Is your body index greater than 35 kg/m? YES   A (age) Are you 33 years old or older? NO   N (neck) Do you have a neck circumference greater than 16 inches?   NO   G (gender) Are you a female? NO   TOTAL STOP/BANG "YES" ANSWERS 3                                                                       For Office Use Only              Procedure Order Form    YES to 3+ Stop Bang questions OR two clinical symptoms - patient qualifies for WatchPAT (CPT 95800)             Clinical Notes: Will consult Sleep Specialist and refer for management of therapy due to patient increased risk of Sleep Apnea. Ordering a sleep study due to the following two clinical symptoms: Excessive daytime sleepiness G47.10 / Gastroesophageal reflux K21.9 / Nocturia R35.1 / Morning Headaches G44.221 / Difficulty concentrating R41.840 / Memory problems or poor judgment G31.84 / Personality changes or irritability R45.4 / Loud snoring R06.83 / Depression F32.9 / Unrefreshed by sleep G47.8 / Impotence N52.9 / History of high blood pressure R03.0 / Insomnia G47.00    I understand that I am proceeding with a home sleep apnea test as ordered by my treating physician. I understand that untreated sleep apnea is a serious cardiovascular risk factor and it is my responsibility to perform the test and seek management for sleep apnea. I will be contacted with the results and be managed for sleep apnea by a local  sleep physician. I will be receiving equipment and further instructions from [facility]. I shall promptly ship back the equipment via the included mailing label. I understand my insurance will be billed for the test and as the patient I am responsible for any insurance related out-of-pocket costs incurred. I have been provided with written instructions and can call for additional video or telephonic instruction, with 24-hour availability of qualified personnel to answer any questions: Patient Help Desk [phone].  Patient Signature ______________________________________________________   Date______________________ Patient Telemedicine Verbal Consent

## 2022-12-01 NOTE — Addendum Note (Signed)
Encounter addended by: Demetrius Charity, RN on: 12/01/2022 3:33 PM  Actions taken: Order list changed, Diagnosis association updated

## 2022-12-01 NOTE — Patient Instructions (Addendum)
Thank you for coming in today    If you had labs drawn today, any labs that are abnormal the clinic will call you No news is good news  Your provider has recommended that you have a home sleep study (Itamar Test).  We have provided you with the equipment in our office today. Please go ahead and download the app. DO NOT OPEN OR TAMPER WITH THE BOX UNTIL WE ADVISE YOU TO DO SO. Once insurance has approved the test our office will call you with PIN number and approval to proceed with testing. Once you have completed the test you just dispose of the equipment, the information is automatically uploaded to [place] via blue-tooth technology. If your test is positive for sleep apnea and you need a home CPAP machine you will be contacted by Dr Norris Cross office [facility]) to set this up.    Medications: START Jardiance 10 mg 1  tablet daily  [email or web address]    Follow up appointments:  Your physician recommends that you schedule a follow-up appointment in:  4 months With Dr. Gala Romney You will receive a reminder letter in the mail a few months in advance. If you don't receive a letter, please call our office to schedule the follow-up appointment.   Please establish care with a Primary Care Physician   Do the following things EVERYDAY: Weigh yourself in the morning before breakfast. Write it down and keep it in a log. Take your medicines as prescribed Eat low salt foods--Limit salt (sodium) to 2000 mg per day.  Stay as active as you can everyday Limit all fluids for the day to less than 2 liters   At the [facility], you and your health needs are our priority. As part of our continuing mission to provide you with exceptional heart care, we have created designated Provider Care Teams. These Care Teams include your primary Cardiologist (physician) and Advanced Practice Providers (APPs- Physician Assistants and  Nurse Practitioners) who all work together to provide you with the care you need, when you need it.   You may see any of the following providers on your designated Care Team at your next follow up: Dr Arvilla Meres Dr Marca Ancona Dr. Marcos Eke, NP Robbie Lis, [place] [facility] [place], [place] Brynda Peon, NP Karle Plumber, PharmD   Please be sure to bring in all your medications bottles to every appointment.    Thank you for choosing [facility]  If you have any questions or concerns before your next appointment please send [place] a message through [place] or call our office at [phone].    TO LEAVE A MESSAGE FOR THE NURSE SELECT OPTION 2, PLEASE LEAVE A MESSAGE INCLUDING: YOUR NAME DATE OF BIRTH CALL BACK NUMBER REASON FOR CALL**this is important as we prioritize the call backs  YOU WILL RECEIVE A CALL BACK THE SAME DAY AS LONG AS YOU CALL BEFORE 4:00 PM

## 2022-12-01 NOTE — Addendum Note (Signed)
Encounter addended by: Demetrius Charity, RN on: 12/01/2022 9:56 AM  Actions taken: Clinical Note Signed

## 2022-12-01 NOTE — Progress Notes (Signed)
ADVANCED HF CLINIC NOTE   Primary Care: Patient, No Pcp Per HF Cardiologist: Dr. 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 She returned 4/24 to re-establish HF care after a 5-year absence from AHF Clinic.   Underwent normal vaginal delivery on 06/18/14 but the next day she developed dyspnea and was found to be in CHF. Echo showed EF 20-25%. We followed her for several years and EF improved to 55% despite some intolerance to GDMT agents. Last seen 06/2017.   Returned to clinic 4/24, BP 150/102 and off meds since 2020.  POCUS showed EF 45%. Entresto started.  Echo (5/24) showed EF 50%, RV ok  Today she returns for HF follow up. Overall feeling fine. She is not SOB with activity, has mild dyspnea walking up stairs. Main concern is smart watch showed oxygen sat down to 80's when sleeping. Fiance say she snores. Denies palpitations, CP, dizziness, edema, or PND/Orthopnea. Appetite ok. No fever or chills. Taking all medications. She has 2 sons. BP at home 108/80. Drinks rare ETOH, no excessive Tylenol use. 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 60 tablet 6   No current facility-administered medications for this encounter.   Allergies  Allergen Reactions   Penicillins Swelling   Social History   Socioeconomic History   Marital status: Single    Spouse name: Not on  file   Number of children: Not on file   Years of education: Not on file   Highest education level: Not on file  Occupational History   Not on file  Tobacco Use   Smoking status: Never   Smokeless tobacco: Never  Substance and Sexual Activity   Alcohol use: No   Drug use: No   Sexual activity: Not on file  Other Topics Concern   Not on file  Social History Narrative   Not on file   Social Determinants of Health   Financial Resource Strain: Not on file  Food Insecurity: Not on file  Transportation Needs: Not on file  Physical Activity: Not on file  Stress: Not on file  Social Connections: Not on file  Intimate Partner Violence: Not on file   Family History  Problem Relation Age of Onset   Hypertension Mother    Heart disease Maternal Grandmother    Asthma Maternal Grandmother    COPD Maternal Grandmother    Diabetes Maternal Grandmother    Diabetes Maternal Grandfather    BP 110/82   Pulse 71   Wt 100.3 kg (221 lb 3.2 oz)   SpO2 99%   BMI 36.81 kg/m   Wt Readings from Last 3 Encounters:  12/01/22 100.3 kg (221 lb 3.2 oz)  08/15/22 101 kg (222 lb 9.6 oz)  06/22/17 91.9 kg (202 lb 8 oz)   PHYSICAL EXAM: General:  NAD.  No resp difficulty, walked into clinic HEENT: Normal Neck: Supple. No JVD. Carotids 2+ bilat; no bruits. No lymphadenopathy or thryomegaly appreciated. Cor: PMI nondisplaced. Regular rate & rhythm. No rubs, gallops or murmurs. Lungs: Clear Abdomen: Soft, nontender, nondistended. No hepatosplenomegaly. No bruits or masses. Good bowel sounds. Extremities: No cyanosis, clubbing, rash, edema Neuro: Alert & oriented x 3, cranial nerves grossly intact. Moves all 4 extremities w/o difficulty. + tearful   ASSESSMENT & PLAN: 1. Chronic Systolic HF: Peri-partum cardiomyopathy.  - Initially EF 20-25% in 2016 - Echo (2/16): EF 20-25%  - Echo (7/16): EF 45-50% - Echo (10/16): EF 35-40%. - Echo (1/18): EF 50-55%  - Echo (3/19): EF 55% - POCUS echo  09/14/22 in clinic EF ~45%. Unclear if residual peripartum of also hypertensive. - Echo (5/24): EF 50%, RV ok - NYHA I-II, volume OK today. - Start Jardiance 10 mg daily. Discussed potential SE, co-pay card given - Continue Entresto 97/103 bid - Unable to take b-blocker due to bradycardia.  - Off spiro due to hair loss.  - No plans for repeat pregnancy at this point, fiance had vasectomy. - Labs today.  2. HTN, uncontrolled - BP improved. - GDMT as above - Continue to check BP at home - She snores, arrange sleep study  3. Goiter - Thyroid u/s normal - TSH ok - Needs PCP  4. Elevated LFTs - mildly elevated; are ETOH, no acetaminophen use - Liver u/s a few years back ok - Repeat LFTs today - Needs PCP follow up  5. Pre-Diabetes - A1C 5.7 - Needs PCP, we discussed this today - Start SGLT2i as above  Follow up in 4 months with Dr. Gala Romney  Cassie Ganong, FNP  8:50 AM

## 2022-12-01 NOTE — Progress Notes (Signed)
ITAMAR home sleep study given to patient, all instructions explained, waiver signed, and CLOUDPAT registration complete.  

## 2022-12-06 ENCOUNTER — Ambulatory Visit (HOSPITAL_COMMUNITY)
Admission: RE | Admit: 2022-12-06 | Discharge: 2022-12-06 | Disposition: A | Discharge status: Home / Self Care | Payer: Commercial Managed Care - PPO | Source: Ambulatory Visit | Attending: Cardiology | Admitting: Cardiology

## 2022-12-06 DIAGNOSIS — R7989 Other specified abnormal findings of blood chemistry: Secondary | ICD-10-CM | POA: Insufficient documentation

## 2022-12-06 NOTE — Addendum Note (Signed)
Encounter addended by: Jacklynn Ganong, FNP on: 12/06/2022 1:44 PM  Actions taken: Alternative orders not taken and original order placed, Order list changed

## 2022-12-07 ENCOUNTER — Telehealth (HOSPITAL_COMMUNITY): Payer: Self-pay | Admitting: Surgery

## 2022-12-07 NOTE — Telephone Encounter (Signed)
I called patient to retrieve the serial number from her home sleep study device.  While in clinic the nurse forgot to make note of this SN and it is needed for registration.  Cassie Lawrence says that she is at work and will call back with this information.

## 2022-12-22 ENCOUNTER — Encounter (INDEPENDENT_AMBULATORY_CARE_PROVIDER_SITE_OTHER): Payer: No Typology Code available for payment source | Admitting: Cardiology

## 2022-12-22 DIAGNOSIS — G4719 Other hypersomnia: Secondary | ICD-10-CM

## 2022-12-22 DIAGNOSIS — G4710 Hypersomnia, unspecified: Secondary | ICD-10-CM

## 2022-12-25 ENCOUNTER — Ambulatory Visit: Payer: No Typology Code available for payment source | Attending: Family Medicine

## 2022-12-25 DIAGNOSIS — I5022 Chronic systolic (congestive) heart failure: Secondary | ICD-10-CM

## 2022-12-25 DIAGNOSIS — R0683 Snoring: Secondary | ICD-10-CM

## 2022-12-25 NOTE — Procedures (Signed)
   Patient Information Study Date: 12/22/2022 Patient Name: Cassie Lawrence Patient ID: [id number] Birth Date: [birth year] Age: 37 Gender: Female BMI: 35.4 (W=220 lb, H=5' 6'') Stopbang: 3 Referring Physician: Mikki Santee  TEST DESCRIPTION: Home sleep apnea testing was completed using the WatchPat, a Type 1 device, utilizing peripheral arterial tonometry (PAT), chest movement, actigraphy, pulse oximetry, pulse rate, body position and snore. AHI was calculated with apnea and hypopnea using valid sleep time as the denominator. RDI includes apneas, hypopneas, and RERAs. The data acquired and the scoring of sleep and all associated events were performed in accordance with the recommended standards and specifications as outlined in the AASM Manual for the Scoring of Sleep and Associated Events 2.2.0 (2015).   FINDINGS: 1.  No evidence of Obstructive Sleep Apnea with AHI 4/hr.  2.  No Central Sleep Apnea. 3.  Oxygen desaturations as low as 85%. 4.  Minimal snoring was present. O2 sats were < 88% for 0 minutes. 5.  Total sleep time was 7 hrs and 20 min. 6.  28.9% of total sleep time was spent in REM sleep.  7.  Normal sleep onset latency at 17 min.  8.  Shortened REM sleep onset latency at 56 min.  9.  Total awakenings were 6.   DIAGNOSIS:  Normal study with no significant sleep disordered breathing.  RECOMMENDATIONS:   1. Normal study with no significant sleep disordered breathing.  2.  Healthy sleep recommendations include:  adequate nightly sleep (normal 7-9 hrs/night), avoidance of caffeine after noon and alcohol near bedtime, and maintaining a sleep environment that is cool, dark and quiet.  3.  Weight loss for overweight patients is recommended.    4.  Snoring recommendations include:  weight loss where appropriate, side sleeping, and avoidance of alcohol before bed.  5.  Operation of motor vehicle or dangerous equipment must be avoided when feeling drowsy, excessively  sleepy, or mentally fatigued.    6.  An ENT consultation which may be useful for specific causes of and possible treatment of bothersome snoring.   7. Weight loss may be of benefit in reducing the severity of snoring.   Signature: Armanda Magic, MD; [facility]; Diplomat, American Board of Sleep Medicine Electronically Signed: 12/25/2022 6:07:09 PM

## 2022-12-28 ENCOUNTER — Ambulatory Visit (HOSPITAL_COMMUNITY)
Admission: RE | Admit: 2022-12-28 | Discharge: 2022-12-28 | Disposition: A | Discharge status: Home / Self Care | Payer: Commercial Managed Care - PPO | Source: Ambulatory Visit | Attending: Internal Medicine | Admitting: Internal Medicine

## 2022-12-28 DIAGNOSIS — D509 Iron deficiency anemia, unspecified: Secondary | ICD-10-CM | POA: Diagnosis present

## 2022-12-28 MED ORDER — SODIUM CHLORIDE 0.9 % IV SOLN
510.0000 mg | Freq: Once | INTRAVENOUS | Status: AC
  Administered 2022-12-28: 510 mg via INTRAVENOUS
  Filled 2022-12-28: qty 510

## 2023-01-04 ENCOUNTER — Telehealth: Payer: Self-pay | Admitting: *Deleted

## 2023-01-04 NOTE — Telephone Encounter (Signed)
-----   Message from Armanda Magic sent at 12/25/2022  6:10 PM EDT ----- Please let patient know that sleep study showed no significant sleep apnea.

## 2023-01-04 NOTE — Telephone Encounter (Signed)
The patient has been notified of the result and verbalized understanding.  All questions (if any) were answered. Latrelle Dodrill, CMA 01/04/2023 5:25 PM     Pt is aware and agreeable to normal results.

## 2023-01-05 ENCOUNTER — Ambulatory Visit (HOSPITAL_COMMUNITY)
Admission: RE | Admit: 2023-01-05 | Discharge: 2023-01-05 | Disposition: A | Discharge status: Home / Self Care | Payer: Commercial Managed Care - PPO | Source: Ambulatory Visit | Attending: Family Medicine | Admitting: Family Medicine

## 2023-01-05 DIAGNOSIS — R7989 Other specified abnormal findings of blood chemistry: Secondary | ICD-10-CM | POA: Diagnosis present

## 2023-04-27 ENCOUNTER — Other Ambulatory Visit (HOSPITAL_COMMUNITY): Payer: Self-pay | Admitting: Internal Medicine

## 2023-05-28 ENCOUNTER — Other Ambulatory Visit (HOSPITAL_COMMUNITY): Payer: Self-pay

## 2023-05-28 ENCOUNTER — Telehealth (HOSPITAL_COMMUNITY): Payer: Self-pay

## 2023-05-28 NOTE — Telephone Encounter (Signed)
Advanced [facility] Patient Advocate Encounter  Prior authorization for [place] Pepper has been submitted and approved. Test billing returns $70 for 90 day supply. This patient already has a copay savings card for Jardiance.  Key: [id number] Effective: 05/28/2023 to 05/27/2026  Burnell Blanks, CPhT Rx Patient Advocate Phone: [phone]

## 2023-05-31 ENCOUNTER — Telehealth (HOSPITAL_COMMUNITY): Payer: Self-pay | Admitting: Pharmacy Technician

## 2023-05-31 ENCOUNTER — Encounter (HOSPITAL_COMMUNITY): Payer: Self-pay

## 2023-05-31 ENCOUNTER — Other Ambulatory Visit (HOSPITAL_COMMUNITY): Payer: Self-pay | Admitting: Cardiology

## 2023-05-31 MED ORDER — ENTRESTO 97-103 MG PO TABS: 1.0000 | ORAL_TABLET | Freq: Two times a day (BID) | ORAL | 6 refills | Status: DC

## 2023-05-31 NOTE — Telephone Encounter (Signed)
 Advanced [facility] Patient Advocate Encounter  Received a message that the patient is not able to get the Entresto . Called the pharmacy and the Entresto  was $10, and the Jardiance  was $35. Looks like they were trying to fill generic Entresto , which is not available yet.   Called and spoke with the patient. Sent co-pay card link for Jardiance  via mychart.  Almarie JULIANNA Pa, CPhT

## 2023-07-28 ENCOUNTER — Other Ambulatory Visit (HOSPITAL_COMMUNITY): Payer: Self-pay | Admitting: Family Medicine

## 2023-08-27 ENCOUNTER — Telehealth (HOSPITAL_COMMUNITY): Payer: Self-pay | Admitting: Internal Medicine

## 2023-08-27 NOTE — Telephone Encounter (Signed)
 Called to confirm/remind patient of their appointment at the [facility] on 08/27/2023.   Appointment:   [x] Confirmed  [] Left mess   [] No answer/No voice mail  [] VM Full/unable to leave message  [] Phone not in service  Patient reminded to bring all medications and/or complete list.  Confirmed patient has transportation. Gave directions, instructed to utilize valet parking.

## 2023-08-28 ENCOUNTER — Ambulatory Visit (HOSPITAL_COMMUNITY)
Admission: RE | Admit: 2023-08-28 | Discharge: 2023-08-28 | Disposition: A | Discharge status: Home / Self Care | Source: Ambulatory Visit | Attending: Internal Medicine | Admitting: Internal Medicine

## 2023-08-28 ENCOUNTER — Encounter (HOSPITAL_COMMUNITY): Payer: Self-pay | Admitting: Internal Medicine

## 2023-08-28 VITALS — BP 130/80 | HR 68 | Wt 220.4 lb

## 2023-08-28 DIAGNOSIS — E669 Obesity, unspecified: Secondary | ICD-10-CM | POA: Diagnosis not present

## 2023-08-28 DIAGNOSIS — I498 Other specified cardiac arrhythmias: Secondary | ICD-10-CM | POA: Insufficient documentation

## 2023-08-28 DIAGNOSIS — I10 Essential (primary) hypertension: Secondary | ICD-10-CM | POA: Diagnosis not present

## 2023-08-28 DIAGNOSIS — E049 Nontoxic goiter, unspecified: Secondary | ICD-10-CM | POA: Insufficient documentation

## 2023-08-28 DIAGNOSIS — Z8249 Family history of ischemic heart disease and other diseases of the circulatory system: Secondary | ICD-10-CM | POA: Insufficient documentation

## 2023-08-28 DIAGNOSIS — I5022 Chronic systolic (congestive) heart failure: Secondary | ICD-10-CM | POA: Diagnosis not present

## 2023-08-28 DIAGNOSIS — D508 Other iron deficiency anemias: Secondary | ICD-10-CM | POA: Diagnosis not present

## 2023-08-28 DIAGNOSIS — D509 Iron deficiency anemia, unspecified: Secondary | ICD-10-CM | POA: Insufficient documentation

## 2023-08-28 DIAGNOSIS — I110 Hypertensive heart disease with heart failure: Secondary | ICD-10-CM | POA: Insufficient documentation

## 2023-08-28 LAB — HEMOGLOBIN A1C
Hgb A1c MFr Bld: 5.1 % (ref 4.8–5.6)
Mean Plasma Glucose: 99.67 mg/dL

## 2023-08-28 LAB — IRON AND TIBC
Iron: 53 ug/dL (ref 28–170)
Saturation Ratios: 13 % (ref 10.4–31.8)
TIBC: 419 ug/dL (ref 250–450)
UIBC: 366 ug/dL

## 2023-08-28 LAB — COMPREHENSIVE METABOLIC PANEL WITH GFR
ALT: 51 U/L — ABNORMAL HIGH (ref 0–44)
AST: 37 U/L (ref 15–41)
Albumin: 3.7 g/dL (ref 3.5–5.0)
Alkaline Phosphatase: 81 U/L (ref 38–126)
Anion gap: 7 (ref 5–15)
BUN: 15 mg/dL (ref 6–20)
CO2: 25 mmol/L (ref 22–32)
Calcium: 9.2 mg/dL (ref 8.9–10.3)
Chloride: 106 mmol/L (ref 98–111)
Creatinine, Ser: 0.65 mg/dL (ref 0.44–1.00)
GFR, Estimated: >60 mL/min (ref >60)
Glucose, Bld: 85 mg/dL (ref 70–99)
Potassium: 3.9 mmol/L (ref 3.5–5.1)
Sodium: 138 mmol/L (ref 135–145)
Total Bilirubin: 0.8 mg/dL (ref 0.0–1.2)
Total Protein: 7.8 g/dL (ref 6.5–8.1)

## 2023-08-28 LAB — CBC
HCT: 43 % (ref 36.0–46.0)
Hemoglobin: 14 g/dL (ref 12.0–15.0)
MCH: 29.2 pg (ref 26.0–34.0)
MCHC: 32.6 g/dL (ref 30.0–36.0)
MCV: 89.8 fL (ref 80.0–100.0)
Platelets: 272 10*3/uL (ref 150–400)
RBC: 4.79 MIL/uL (ref 3.87–5.11)
RDW: 13.2 % (ref 11.5–15.5)
WBC: 7.7 10*3/uL (ref 4.0–10.5)
nRBC: 0 % (ref 0.0–0.2)

## 2023-08-28 LAB — FERRITIN: Ferritin: 11 ng/mL (ref 11–307)

## 2023-08-28 LAB — BRAIN NATRIURETIC PEPTIDE: B Natriuretic Peptide: 29.7 pg/mL (ref 0.0–100.0)

## 2023-08-28 NOTE — Patient Instructions (Signed)
 Great to see you today!!!  Labs done today, your results will be available in MyChart, we will contact you for abnormal readings.  Your physician recommends that you schedule a follow-up appointment in: 9 months (FEBRUARY), **PLEASE CALL OUR OFFICE IN DECEMBER TO SCHEDULE THIS APPOINTMENT  If you have any questions or concerns before your next appointment please send us  a message through [place] or call our office at [phone].    TO LEAVE A MESSAGE FOR THE NURSE SELECT OPTION 2, PLEASE LEAVE A MESSAGE INCLUDING: YOUR NAME DATE OF BIRTH CALL BACK NUMBER REASON FOR CALL**this is important as we prioritize the call backs  YOU WILL RECEIVE A CALL BACK THE SAME DAY AS LONG AS YOU CALL BEFORE 4:00 PM  At the [facility], you and your health needs are our priority. As part of our continuing mission to provide you with exceptional heart care, we have created designated Provider Care Teams. These Care Teams include your primary Cardiologist (physician) and Advanced Practice Providers (APPs- Physician Assistants and Nurse Practitioners) who all work together to provide you with the care you need, when you need it.   You may see any of the following providers on your designated Care Team at your next follow up: Dr Jules Oar Dr Peder Bourdon Dr. Alwin Baars Dr. Arta Lark Amy Marijane Shoulders, NP Ruddy Corral, [facility] [place], [place] Dennise Fitz, NP [place] Lee, NP Shawnee Dellen, NP Luster Salters, PharmD Bevely Brush, PharmD   Please be sure to bring in all your medications bottles to every appointment.    Thank you for choosing [facility]

## 2023-08-28 NOTE — Addendum Note (Signed)
 Encounter addended by: Charmaine Coop, RN on: 08/28/2023 12:19 PM  Actions taken: Order list changed, Diagnosis association updated, Clinical Note Signed

## 2023-08-28 NOTE — Addendum Note (Signed)
 Encounter addended by: Debbora Fair, RN on: 08/28/2023 12:19 PM  Actions taken: Charge Capture section accepted

## 2023-08-28 NOTE — Progress Notes (Signed)
 [facility] NOTE   Primary Care: Patient, No Pcp Per HF Cardiologist: Dr. 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 She returned 4/24 to re-establish HF care after a 5-year absence from [facility].   Underwent normal vaginal delivery on 06/18/14 but the next day she developed dyspnea and was found to be in CHF. Echo showed EF 20-25%. We followed her for several years and EF improved to 55% despite some intolerance to GDMT agents. Last seen 06/2017.   Returned to clinic 4/24, BP 150/102 and off meds since 2020.  POCUS showed EF 45%. Entresto  started.  Echo (5/24) EF 50%, RV ok  Today she returns for HF follow up. 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 Feels good. Trying to be more active. Going to the gym a couple times per week. Doing weights and some cardio. Also walking outside at least 1 mile. No undue fatigue or SOB (unless it is very humid). Compliant with meds. Had been taking Entresto  once per day because BP was low when exercising   Cardiac Studies - Echo (5/24): EF 50%, RV ok - Echo (3/19): EF 55%  - Echo (3/18): EF 50-55%  - Echo (10/16): EF 35049% - Echo (7/16): EF 45-50% - Echo (2/16): EF 20-25%   Past Medical History:  Diagnosis Date   H/O candidiasis    H/O chlamydia infection    H/O varicella    History of gonorrhea    No pertinent past medical history    Trichomonas infection    Current Outpatient Medications  Medication Sig Dispense Refill   empagliflozin  (JARDIANCE ) 10 MG TABS tablet TAKE 1 TABLET BY MOUTH DAILY BEFORE BREAKFAST. 30 tablet 1   ENTRESTO  97-103 MG Take 1 tablet by mouth 2 (two) times daily. 60 tablet 6   No current facility-administered medications for this encounter.   Allergies  Allergen Reactions   Penicillins Swelling   Social History   Socioeconomic History   Marital status: Single    Spouse name: Not on file   Number  of children: Not on file   Years of education: Not on file   Highest education level: Not on file  Occupational History   Not on file  Tobacco Use   Smoking status: Never   Smokeless tobacco: Never  Substance and Sexual Activity   Alcohol use: No   Drug use: No   Sexual activity: Not on file  Other Topics Concern   Not on file  Social History Narrative   Not on file   Social Drivers of Health   Financial Resource Strain: Not on file  Food Insecurity: Not on file  Transportation Needs: Not on file  Physical Activity: Not on file  Stress: Not on file  Social Connections: Not on file  Intimate Partner Violence: Not on file   Family History  Problem Relation Age of Onset   Hypertension Mother    Heart disease Maternal Grandmother    Asthma Maternal Grandmother    COPD Maternal Grandmother    Diabetes Maternal Grandmother    Diabetes Maternal Grandfather    BP 130/80   Pulse 68   Wt 100 kg (220 lb 6.4 oz)   SpO2 98%   BMI 36.68 kg/m   Wt Readings from Last 3 Encounters:  08/28/23 100 kg (220 lb 6.4 oz)  12/28/22 98.4 kg (216 lb 14.4 oz)  12/01/22 100.3 kg (  221 lb 3.2 oz)   PHYSICAL EXAM: General:  Well appearing. No resp difficulty HEENT: normal Neck: supple. no JVD. Carotids 2+ bilat; no bruits. + goiter Cor: PMI nondisplaced. Regular rate & rhythm. No rubs, gallops or murmurs. Lungs: clear Abdomen: soft, nontender, nondistended. No hepatosplenomegaly. No bruits or masses. Good bowel sounds. Extremities: no cyanosis, clubbing, rash, edema Neuro: alert & orientedx3, cranial nerves grossly intact. moves all 4 extremities w/o difficulty. Affect pleasant  ECG: SR 61 1AVB No ST-T wave abnormalities.    ASSESSMENT & PLAN:  1. Chronic Systolic HF: Peri-partum cardiomyopathy.  - Initially EF 20-25% in 2016 - Echo (2/16): EF 20-25%  - Echo (7/16): EF 45-50% - Echo (10/16): EF 35-40%. - Echo (1/18): EF 50-55%  - Echo (3/19): EF 55% - POCUS echo 09/14/22 in  clinic EF ~45%. Unclear if residual peripartum of also hypertensive. - Echo (5/24): EF 50%, RV ok - NYHA I Volume ok  - Continue Jardiance  10 mg daily. - Continue Entresto  97/103 bid. If BP running low (SBP consistently < 110)can cut back to 49/51 bid - Unable to take b-blocker due to bradycardia.  - Off spiro due to hair loss.  - No plans for repeat pregnancy at this point, fiance had vasectomy. - Due for repeat echo  2. HTN, uncontrolled - BP much improved - Getting some low BPs at times - Adjust Entresto  as above  3. Goiter - Thyroid  u/s normal - TSH ok - Needs PCP  4. Obesity - continue diet/weight loss - check HGBA1c if has DM2 consider GLP1RA   5. Iron deficiency anemia - got 1 dose IV iron - recheck   Cynia Abruzzo, MD  12:02 PM

## 2023-10-05 ENCOUNTER — Other Ambulatory Visit (HOSPITAL_COMMUNITY): Payer: Self-pay | Admitting: Family Medicine

## 2023-11-30 ENCOUNTER — Other Ambulatory Visit (HOSPITAL_COMMUNITY): Payer: Self-pay | Admitting: Family Medicine

## 2024-03-06 ENCOUNTER — Other Ambulatory Visit (HOSPITAL_COMMUNITY): Payer: Self-pay | Admitting: Internal Medicine
